# Patient Record
Sex: Female | Born: 1960 | Race: Black or African American | Hispanic: No | Marital: Married | Smoking: Never smoker
Health system: Southern US, Community
[De-identification: ages and names within clinical notes are randomized; demographics above are authoritative.]

## PROBLEM LIST (undated history)

## (undated) DIAGNOSIS — Z86010 Personal history of colonic polyps: Secondary | ICD-10-CM

## (undated) DIAGNOSIS — I10 Essential (primary) hypertension: Secondary | ICD-10-CM

## (undated) DIAGNOSIS — IMO0001 Reserved for inherently not codable concepts without codable children: Secondary | ICD-10-CM

## (undated) DIAGNOSIS — D649 Anemia, unspecified: Secondary | ICD-10-CM

## (undated) DIAGNOSIS — J189 Pneumonia, unspecified organism: Secondary | ICD-10-CM

## (undated) DIAGNOSIS — I5022 Chronic systolic (congestive) heart failure: Secondary | ICD-10-CM

## (undated) DIAGNOSIS — D120 Benign neoplasm of cecum: Secondary | ICD-10-CM

## (undated) DIAGNOSIS — I509 Heart failure, unspecified: Secondary | ICD-10-CM

## (undated) HISTORY — DX: Anemia, unspecified: D64.9

## (undated) HISTORY — PX: OTHER SURGICAL HISTORY: SHX169

## (undated) HISTORY — DX: Pneumonia, unspecified organism: J18.9

---

## 1999-06-09 ENCOUNTER — Other Ambulatory Visit: Admission: RE | Admit: 1999-06-09 | Discharge: 1999-06-09 | Payer: Self-pay | Admitting: *Deleted

## 1999-07-14 ENCOUNTER — Other Ambulatory Visit: Admission: RE | Admit: 1999-07-14 | Discharge: 1999-07-14 | Payer: Self-pay | Admitting: *Deleted

## 2000-07-19 ENCOUNTER — Encounter: Admission: RE | Admit: 2000-07-19 | Discharge: 2000-08-10 | Payer: Self-pay | Admitting: Orthopedic Surgery

## 2003-02-01 ENCOUNTER — Emergency Department (HOSPITAL_COMMUNITY): Admission: EM | Admit: 2003-02-01 | Discharge: 2003-02-01 | Payer: Self-pay | Admitting: Emergency Medicine

## 2004-03-22 ENCOUNTER — Emergency Department (HOSPITAL_COMMUNITY): Admission: EM | Admit: 2004-03-22 | Discharge: 2004-03-22 | Payer: Self-pay | Admitting: Internal Medicine

## 2005-03-08 ENCOUNTER — Other Ambulatory Visit: Admission: RE | Admit: 2005-03-08 | Discharge: 2005-03-08 | Payer: Self-pay | Admitting: Obstetrics and Gynecology

## 2008-04-27 ENCOUNTER — Ambulatory Visit: Payer: Self-pay | Admitting: Internal Medicine

## 2008-04-27 ENCOUNTER — Encounter (INDEPENDENT_AMBULATORY_CARE_PROVIDER_SITE_OTHER): Payer: Self-pay | Admitting: Internal Medicine

## 2008-04-27 ENCOUNTER — Inpatient Hospital Stay (HOSPITAL_COMMUNITY): Admission: EM | Admit: 2008-04-27 | Discharge: 2008-05-04 | Payer: Self-pay | Admitting: Emergency Medicine

## 2008-04-29 ENCOUNTER — Encounter (INDEPENDENT_AMBULATORY_CARE_PROVIDER_SITE_OTHER): Payer: Self-pay | Admitting: Internal Medicine

## 2008-05-01 ENCOUNTER — Encounter: Payer: Self-pay | Admitting: Internal Medicine

## 2008-05-17 ENCOUNTER — Telehealth: Payer: Self-pay | Admitting: Internal Medicine

## 2008-06-04 ENCOUNTER — Encounter (INDEPENDENT_AMBULATORY_CARE_PROVIDER_SITE_OTHER): Payer: Self-pay | Admitting: *Deleted

## 2008-06-04 ENCOUNTER — Ambulatory Visit: Payer: Self-pay | Admitting: Internal Medicine

## 2008-06-04 DIAGNOSIS — I5022 Chronic systolic (congestive) heart failure: Secondary | ICD-10-CM

## 2008-06-17 ENCOUNTER — Ambulatory Visit (HOSPITAL_COMMUNITY): Admission: RE | Admit: 2008-06-17 | Discharge: 2008-06-17 | Payer: Self-pay | Admitting: Internal Medicine

## 2008-06-17 ENCOUNTER — Ambulatory Visit: Payer: Self-pay | Admitting: Internal Medicine

## 2008-06-18 ENCOUNTER — Telehealth (INDEPENDENT_AMBULATORY_CARE_PROVIDER_SITE_OTHER): Payer: Self-pay | Admitting: *Deleted

## 2009-02-19 ENCOUNTER — Inpatient Hospital Stay (HOSPITAL_COMMUNITY): Admission: EM | Admit: 2009-02-19 | Discharge: 2009-02-20 | Payer: Self-pay | Admitting: Emergency Medicine

## 2009-02-19 ENCOUNTER — Ambulatory Visit: Payer: Self-pay | Admitting: Cardiovascular Disease

## 2009-02-20 ENCOUNTER — Encounter (INDEPENDENT_AMBULATORY_CARE_PROVIDER_SITE_OTHER): Payer: Self-pay | Admitting: Internal Medicine

## 2010-03-01 LAB — CONVERTED CEMR LAB
ALT: 40 units/L — ABNORMAL HIGH (ref 0–35)
AST: 35 units/L (ref 0–37)
Albumin: 3.7 g/dL (ref 3.5–5.2)
Alkaline Phosphatase: 56 units/L (ref 39–117)
BUN: 8 mg/dL (ref 6–23)
Bilirubin, Direct: 0.1 mg/dL (ref 0.0–0.3)
CO2: 32 meq/L (ref 19–32)
Calcium: 9.5 mg/dL (ref 8.4–10.5)
Chloride: 104 meq/L (ref 96–112)
Creatinine, Ser: 0.9 mg/dL (ref 0.4–1.2)
GFR calc non Af Amer: 85.39 mL/min (ref >60)
Glucose, Bld: 107 mg/dL — ABNORMAL HIGH (ref 70–99)
Potassium: 4 meq/L (ref 3.5–5.1)
Pro B Natriuretic peptide (BNP): 78 pg/mL (ref 0.0–100.0)
Sodium: 141 meq/L (ref 135–145)
Total Bilirubin: 0.5 mg/dL (ref 0.3–1.2)
Total Protein: 8.2 g/dL (ref 6.0–8.3)

## 2010-04-19 LAB — CBC
HCT: 33.4 % — ABNORMAL LOW (ref 36.0–46.0)
HCT: 35.5 % — ABNORMAL LOW (ref 36.0–46.0)
Hemoglobin: 10.8 g/dL — ABNORMAL LOW (ref 12.0–15.0)
Hemoglobin: 11.6 g/dL — ABNORMAL LOW (ref 12.0–15.0)
MCHC: 32.3 g/dL (ref 30.0–36.0)
MCHC: 32.5 g/dL (ref 30.0–36.0)
MCV: 86 fL (ref 78.0–100.0)
MCV: 87.2 fL (ref 78.0–100.0)
Platelets: 224 10*3/uL (ref 150–400)
Platelets: 248 10*3/uL (ref 150–400)
RBC: 3.83 MIL/uL — ABNORMAL LOW (ref 3.87–5.11)
RBC: 4.13 MIL/uL (ref 3.87–5.11)
RDW: 17.3 % — ABNORMAL HIGH (ref 11.5–15.5)
RDW: 17.5 % — ABNORMAL HIGH (ref 11.5–15.5)
WBC: 3.8 10*3/uL — ABNORMAL LOW (ref 4.0–10.5)
WBC: 4.9 10*3/uL (ref 4.0–10.5)

## 2010-04-19 LAB — BASIC METABOLIC PANEL
BUN: 11 mg/dL (ref 6–23)
BUN: 9 mg/dL (ref 6–23)
CO2: 25 mEq/L (ref 19–32)
CO2: 28 mEq/L (ref 19–32)
CO2: 29 mEq/L (ref 19–32)
Calcium: 8.6 mg/dL (ref 8.4–10.5)
Calcium: 9 mg/dL (ref 8.4–10.5)
Chloride: 103 mEq/L (ref 96–112)
Chloride: 104 mEq/L (ref 96–112)
Chloride: 107 mEq/L (ref 96–112)
Creatinine, Ser: 0.99 mg/dL (ref 0.4–1.2)
Creatinine, Ser: 1.01 mg/dL (ref 0.4–1.2)
GFR calc Af Amer: >60 mL/min (ref >60)
GFR calc Af Amer: >60 mL/min (ref >60)
GFR calc Af Amer: >60 mL/min (ref >60)
GFR calc non Af Amer: 58 mL/min — ABNORMAL LOW (ref >60)
GFR calc non Af Amer: 59 mL/min — ABNORMAL LOW (ref >60)
Glucose, Bld: 107 mg/dL — ABNORMAL HIGH (ref 70–99)
Glucose, Bld: 114 mg/dL — ABNORMAL HIGH (ref 70–99)
Potassium: 3.5 mEq/L (ref 3.5–5.1)
Potassium: 3.9 mEq/L (ref 3.5–5.1)
Potassium: 4.2 mEq/L (ref 3.5–5.1)
Sodium: 137 mEq/L (ref 135–145)
Sodium: 137 mEq/L (ref 135–145)
Sodium: 138 mEq/L (ref 135–145)

## 2010-04-19 LAB — DIFFERENTIAL
Basophils Absolute: 0 10*3/uL (ref 0.0–0.1)
Basophils Relative: 1 % (ref 0–1)
Eosinophils Absolute: 0 10*3/uL (ref 0.0–0.7)
Eosinophils Relative: 0 % (ref 0–5)
Lymphocytes Relative: 38 % (ref 12–46)
Lymphs Abs: 1.5 10*3/uL (ref 0.7–4.0)
Monocytes Absolute: 0.5 10*3/uL (ref 0.1–1.0)
Monocytes Relative: 12 % (ref 3–12)
Neutro Abs: 1.9 10*3/uL (ref 1.7–7.7)
Neutrophils Relative %: 49 % (ref 43–77)

## 2010-04-19 LAB — URINE MICROSCOPIC-ADD ON

## 2010-04-19 LAB — BRAIN NATRIURETIC PEPTIDE
Pro B Natriuretic peptide (BNP): 558 pg/mL — ABNORMAL HIGH (ref 0.0–100.0)
Pro B Natriuretic peptide (BNP): 985 pg/mL — ABNORMAL HIGH (ref 0.0–100.0)

## 2010-04-19 LAB — URINALYSIS, ROUTINE W REFLEX MICROSCOPIC
Bilirubin Urine: NEGATIVE
Glucose, UA: NEGATIVE mg/dL
Ketones, ur: NEGATIVE mg/dL
Leukocytes, UA: NEGATIVE
Nitrite: NEGATIVE
Protein, ur: NEGATIVE mg/dL
Specific Gravity, Urine: 1.015 (ref 1.005–1.030)
Urobilinogen, UA: 1 mg/dL (ref 0.0–1.0)
pH: 6.5 (ref 5.0–8.0)

## 2010-04-19 LAB — CARDIAC PANEL(CRET KIN+CKTOT+MB+TROPI)
CK, MB: 0.9 ng/mL (ref 0.3–4.0)
CK, MB: 1 ng/mL (ref 0.3–4.0)
Relative Index: INVALID (ref 0.0–2.5)
Relative Index: INVALID (ref 0.0–2.5)
Total CK: 70 U/L (ref 7–177)
Total CK: 94 U/L (ref 7–177)
Troponin I: 0.02 ng/mL (ref 0.00–0.06)

## 2010-04-19 LAB — PHOSPHORUS: Phosphorus: 3.7 mg/dL (ref 2.3–4.6)

## 2010-04-19 LAB — LIPID PANEL
LDL Cholesterol: 74 mg/dL (ref 0–99)
Total CHOL/HDL Ratio: 2.7 RATIO
VLDL: 21 mg/dL (ref 0–40)

## 2010-04-19 LAB — MAGNESIUM: Magnesium: 2.2 mg/dL (ref 1.5–2.5)

## 2010-04-19 LAB — TSH: TSH: 2.264 u[IU]/mL (ref 0.350–4.500)

## 2010-04-19 LAB — D-DIMER, QUANTITATIVE: D-Dimer, Quant: 0.81 ug/mL-FEU — ABNORMAL HIGH (ref 0.00–0.48)

## 2010-04-19 LAB — APTT: aPTT: 23 seconds — ABNORMAL LOW (ref 24–37)

## 2010-04-19 LAB — PROTIME-INR
INR: 1.02 (ref 0.00–1.49)
Prothrombin Time: 13.3 seconds (ref 11.6–15.2)

## 2010-05-13 LAB — BASIC METABOLIC PANEL
CO2: 26 mEq/L (ref 19–32)
Calcium: 9 mg/dL (ref 8.4–10.5)
Calcium: 9.2 mg/dL (ref 8.4–10.5)
Chloride: 102 mEq/L (ref 96–112)
Chloride: 106 mEq/L (ref 96–112)
Creatinine, Ser: 0.85 mg/dL (ref 0.4–1.2)
GFR calc Af Amer: >60 mL/min (ref >60)
GFR calc Af Amer: >60 mL/min (ref >60)
Glucose, Bld: 128 mg/dL — ABNORMAL HIGH (ref 70–99)
Potassium: 4.2 mEq/L (ref 3.5–5.1)
Sodium: 139 mEq/L (ref 135–145)

## 2010-05-13 LAB — CBC
HCT: 33.6 % — ABNORMAL LOW (ref 36.0–46.0)
HCT: 33.9 % — ABNORMAL LOW (ref 36.0–46.0)
Hemoglobin: 10.6 g/dL — ABNORMAL LOW (ref 12.0–15.0)
MCHC: 31.2 g/dL (ref 30.0–36.0)
MCHC: 31.7 g/dL (ref 30.0–36.0)
MCV: 76.2 fL — ABNORMAL LOW (ref 78.0–100.0)
MCV: 76.9 fL — ABNORMAL LOW (ref 78.0–100.0)
Platelets: 321 10*3/uL (ref 150–400)
RBC: 4.46 MIL/uL (ref 3.87–5.11)
WBC: 4.7 10*3/uL (ref 4.0–10.5)

## 2010-05-13 LAB — BRAIN NATRIURETIC PEPTIDE: Pro B Natriuretic peptide (BNP): 280 pg/mL — ABNORMAL HIGH (ref 0.0–100.0)

## 2010-05-13 LAB — COMPREHENSIVE METABOLIC PANEL
Albumin: 3.6 g/dL (ref 3.5–5.2)
BUN: 10 mg/dL (ref 6–23)
Calcium: 9.5 mg/dL (ref 8.4–10.5)
Chloride: 106 mEq/L (ref 96–112)
Creatinine, Ser: 0.99 mg/dL (ref 0.4–1.2)
GFR calc non Af Amer: 60 mL/min — ABNORMAL LOW (ref >60)
Total Bilirubin: 0.8 mg/dL (ref 0.3–1.2)

## 2010-05-13 LAB — PROTEIN ELECTROPHORESIS, SERUM
Gamma Globulin: 20.8 % — ABNORMAL HIGH (ref 11.1–18.8)
M-Spike, %: NOT DETECTED g/dL

## 2010-05-13 LAB — HEPATITIS B CORE ANTIBODY, IGM

## 2010-05-14 LAB — HEPATIC FUNCTION PANEL
ALT: 93 U/L — ABNORMAL HIGH (ref 0–35)
AST: 40 U/L — ABNORMAL HIGH (ref 0–37)
Albumin: 2.9 g/dL — ABNORMAL LOW (ref 3.5–5.2)
Bilirubin, Direct: 0.1 mg/dL (ref 0.0–0.3)
Total Bilirubin: 0.7 mg/dL (ref 0.3–1.2)

## 2010-05-14 LAB — BASIC METABOLIC PANEL
BUN: 6 mg/dL (ref 6–23)
CO2: 24 mEq/L (ref 19–32)
CO2: 27 mEq/L (ref 19–32)
Calcium: 8.6 mg/dL (ref 8.4–10.5)
Chloride: 102 mEq/L (ref 96–112)
Chloride: 110 mEq/L (ref 96–112)
Creatinine, Ser: 1 mg/dL (ref 0.4–1.2)
GFR calc Af Amer: >60 mL/min (ref >60)
GFR calc Af Amer: >60 mL/min (ref >60)
GFR calc Af Amer: >60 mL/min (ref >60)
GFR calc non Af Amer: 55 mL/min — ABNORMAL LOW (ref >60)
Glucose, Bld: 99 mg/dL (ref 70–99)
Potassium: 4.2 mEq/L (ref 3.5–5.1)
Sodium: 133 mEq/L — ABNORMAL LOW (ref 135–145)
Sodium: 138 mEq/L (ref 135–145)
Sodium: 139 mEq/L (ref 135–145)

## 2010-05-14 LAB — LIPID PANEL
Cholesterol: 78 mg/dL (ref 0–200)
HDL: 21 mg/dL — ABNORMAL LOW (ref >39)

## 2010-05-14 LAB — CROSSMATCH

## 2010-05-14 LAB — RAPID URINE DRUG SCREEN, HOSP PERFORMED
Amphetamines: NOT DETECTED
Barbiturates: NOT DETECTED
Benzodiazepines: NOT DETECTED
Opiates: NOT DETECTED
Tetrahydrocannabinol: NOT DETECTED

## 2010-05-14 LAB — DIFFERENTIAL
Basophils Absolute: 0 K/uL (ref 0.0–0.1)
Basophils Relative: 1 % (ref 0–1)
Eosinophils Absolute: 0 10*3/uL (ref 0.0–0.7)
Eosinophils Relative: 0 % (ref 0–5)
Lymphocytes Relative: 22 % (ref 12–46)
Lymphs Abs: 1.2 10*3/uL (ref 0.7–4.0)
Monocytes Absolute: 0.6 K/uL (ref 0.1–1.0)
Monocytes Relative: 12 % (ref 3–12)
Neutro Abs: 3.4 K/uL (ref 1.7–7.7)
Neutrophils Relative %: 65 % (ref 43–77)

## 2010-05-14 LAB — PROTIME-INR: Prothrombin Time: 14.8 seconds (ref 11.6–15.2)

## 2010-05-14 LAB — FERRITIN: Ferritin: 7 ng/mL — ABNORMAL LOW (ref 10–291)

## 2010-05-14 LAB — PREGNANCY, URINE: Preg Test, Ur: NEGATIVE

## 2010-05-14 LAB — BRAIN NATRIURETIC PEPTIDE
Pro B Natriuretic peptide (BNP): 1090 pg/mL — ABNORMAL HIGH (ref 0.0–100.0)
Pro B Natriuretic peptide (BNP): 720 pg/mL — ABNORMAL HIGH (ref 0.0–100.0)
Pro B Natriuretic peptide (BNP): 990 pg/mL — ABNORMAL HIGH (ref 0.0–100.0)

## 2010-05-14 LAB — RETICULOCYTES
RBC.: 4.02 MIL/uL (ref 3.87–5.11)
Retic Count, Absolute: 60.3 K/uL (ref 19.0–186.0)
Retic Ct Pct: 1.5 % (ref 0.4–3.1)

## 2010-05-14 LAB — BLOOD GAS, VENOUS
Acid-base deficit: 1.4 mmol/L (ref 0.0–2.0)
Bicarbonate: 22.8 mEq/L (ref 20.0–24.0)
FIO2: 0.21 %
O2 Saturation: 9.4 %
Patient temperature: 98.6
TCO2: 22 mmol/L (ref 0–100)
pCO2, Ven: 38.4 mmHg — ABNORMAL LOW (ref 45.0–50.0)
pH, Ven: 7.391 — ABNORMAL HIGH (ref 7.250–7.300)
pO2, Ven: 13.1 mmHg — CL (ref 30.0–45.0)

## 2010-05-14 LAB — HEPATITIS PANEL, ACUTE
HCV Ab: NEGATIVE
Hep A IgM: NEGATIVE
Hepatitis B Surface Ag: NEGATIVE

## 2010-05-14 LAB — URINALYSIS, ROUTINE W REFLEX MICROSCOPIC
Bilirubin Urine: NEGATIVE
Bilirubin Urine: NEGATIVE
Glucose, UA: NEGATIVE mg/dL
Ketones, ur: NEGATIVE mg/dL
Ketones, ur: NEGATIVE mg/dL
Leukocytes, UA: NEGATIVE
Nitrite: NEGATIVE
Nitrite: NEGATIVE
Protein, ur: 30 mg/dL — AB
Specific Gravity, Urine: 1.022 (ref 1.005–1.030)
Urobilinogen, UA: 0.2 mg/dL (ref 0.0–1.0)
Urobilinogen, UA: 1 mg/dL (ref 0.0–1.0)
pH: 5.5 (ref 5.0–8.0)
pH: 6 (ref 5.0–8.0)

## 2010-05-14 LAB — GLUCOSE, CAPILLARY: Glucose-Capillary: 109 mg/dL — ABNORMAL HIGH (ref 70–99)

## 2010-05-14 LAB — CBC
HCT: 24.8 % — ABNORMAL LOW (ref 36.0–46.0)
HCT: 25.1 % — ABNORMAL LOW (ref 36.0–46.0)
HCT: 25.9 % — ABNORMAL LOW (ref 36.0–46.0)
HCT: 29.4 % — ABNORMAL LOW (ref 36.0–46.0)
Hemoglobin: 7.8 g/dL — CL (ref 12.0–15.0)
Hemoglobin: 7.9 g/dL — CL (ref 12.0–15.0)
Hemoglobin: 8.1 g/dL — ABNORMAL LOW (ref 12.0–15.0)
Hemoglobin: 8.1 g/dL — ABNORMAL LOW (ref 12.0–15.0)
Hemoglobin: 9.1 g/dL — ABNORMAL LOW (ref 12.0–15.0)
MCHC: 31.1 g/dL (ref 30.0–36.0)
MCHC: 31.3 g/dL (ref 30.0–36.0)
MCHC: 31.4 g/dL (ref 30.0–36.0)
MCHC: 31.5 g/dL (ref 30.0–36.0)
MCV: 75 fL — ABNORMAL LOW (ref 78.0–100.0)
MCV: 75.1 fL — ABNORMAL LOW (ref 78.0–100.0)
MCV: 75.3 fL — ABNORMAL LOW (ref 78.0–100.0)
MCV: 75.4 fL — ABNORMAL LOW (ref 78.0–100.0)
Platelets: 252 10*3/uL (ref 150–400)
Platelets: 260 K/uL (ref 150–400)
RBC: 3.3 MIL/uL — ABNORMAL LOW (ref 3.87–5.11)
RBC: 3.34 MIL/uL — ABNORMAL LOW (ref 3.87–5.11)
RBC: 3.4 MIL/uL — ABNORMAL LOW (ref 3.87–5.11)
RBC: 3.43 MIL/uL — ABNORMAL LOW (ref 3.87–5.11)
RBC: 3.9 MIL/uL (ref 3.87–5.11)
RDW: 17.6 % — ABNORMAL HIGH (ref 11.5–15.5)
RDW: 18 % — ABNORMAL HIGH (ref 11.5–15.5)
WBC: 5.3 10*3/uL (ref 4.0–10.5)
WBC: 5.4 10*3/uL (ref 4.0–10.5)
WBC: 5.7 10*3/uL (ref 4.0–10.5)

## 2010-05-14 LAB — URINE MICROSCOPIC-ADD ON

## 2010-05-14 LAB — COMPREHENSIVE METABOLIC PANEL
ALT: 75 U/L — ABNORMAL HIGH (ref 0–35)
AST: 53 U/L — ABNORMAL HIGH (ref 0–37)
BUN: 7 mg/dL (ref 6–23)
CO2: 23 mEq/L (ref 19–32)
Calcium: 8.2 mg/dL — ABNORMAL LOW (ref 8.4–10.5)
Calcium: 8.4 mg/dL (ref 8.4–10.5)
Chloride: 102 mEq/L (ref 96–112)
GFR calc Af Amer: >60 mL/min (ref >60)
GFR calc non Af Amer: >60 mL/min (ref >60)
GFR calc non Af Amer: >60 mL/min (ref >60)
Glucose, Bld: 96 mg/dL (ref 70–99)
Potassium: 3.4 mEq/L — ABNORMAL LOW (ref 3.5–5.1)
Sodium: 132 mEq/L — ABNORMAL LOW (ref 135–145)
Total Bilirubin: 0.8 mg/dL (ref 0.3–1.2)
Total Protein: 5.5 g/dL — ABNORMAL LOW (ref 6.0–8.3)

## 2010-05-14 LAB — CK TOTAL AND CKMB (NOT AT ARMC)
CK, MB: 0.9 ng/mL (ref 0.3–4.0)
CK, MB: 1 ng/mL (ref 0.3–4.0)
CK, MB: 1.1 ng/mL (ref 0.3–4.0)
Relative Index: INVALID (ref 0.0–2.5)
Total CK: 58 U/L (ref 7–177)
Total CK: 59 U/L (ref 7–177)
Total CK: 68 U/L (ref 7–177)

## 2010-05-14 LAB — HEMOCCULT GUIAC POC 1CARD (OFFICE): Fecal Occult Bld: POSITIVE

## 2010-05-14 LAB — COMPREHENSIVE METABOLIC PANEL WITH GFR
Albumin: 3.5 g/dL (ref 3.5–5.2)
Alkaline Phosphatase: 55 U/L (ref 39–117)
BUN: 11 mg/dL (ref 6–23)
Creatinine, Ser: 0.96 mg/dL (ref 0.4–1.2)
Glucose, Bld: 123 mg/dL — ABNORMAL HIGH (ref 70–99)
Total Protein: 6.3 g/dL (ref 6.0–8.3)

## 2010-05-14 LAB — TROPONIN I
Troponin I: 0.02 ng/mL (ref 0.00–0.06)
Troponin I: 0.02 ng/mL (ref 0.00–0.06)

## 2010-05-14 LAB — D-DIMER, QUANTITATIVE: D-Dimer, Quant: 5.24 ug/mL-FEU — ABNORMAL HIGH (ref 0.00–0.48)

## 2010-05-14 LAB — FOLATE: Folate: 10.8 ng/mL

## 2010-05-14 LAB — IRON AND TIBC
Iron: 14 ug/dL — ABNORMAL LOW (ref 42–135)
Saturation Ratios: 4 % — ABNORMAL LOW (ref 20–55)
TIBC: 380 ug/dL (ref 250–470)
UIBC: 366 ug/dL

## 2010-05-14 LAB — TSH: TSH: 1.632 u[IU]/mL (ref 0.350–4.500)

## 2010-05-14 LAB — ANGIOTENSIN CONVERTING ENZYME: Angiotensin-Converting Enzyme: 32 U/L (ref 9–67)

## 2010-05-14 LAB — LACTATE DEHYDROGENASE: LDH: 164 U/L (ref 94–250)

## 2010-05-14 LAB — ABO/RH: ABO/RH(D): O POS

## 2010-05-14 LAB — LIPASE, BLOOD: Lipase: 12 U/L (ref 11–59)

## 2010-05-14 LAB — APTT: aPTT: 30 seconds (ref 24–37)

## 2010-05-14 LAB — VITAMIN B12: Vitamin B-12: 641 pg/mL (ref 211–911)

## 2010-05-25 IMAGING — CR DG CHEST 1V PORT
1 series · 1 of 1 positions shown · non-contrast
Comparison: 03/22/2004

CLINICAL DATA: Weakness.  Vomiting.  Chest discomfort.

PORTABLE CHEST - 1 VIEW

[view not recorded]
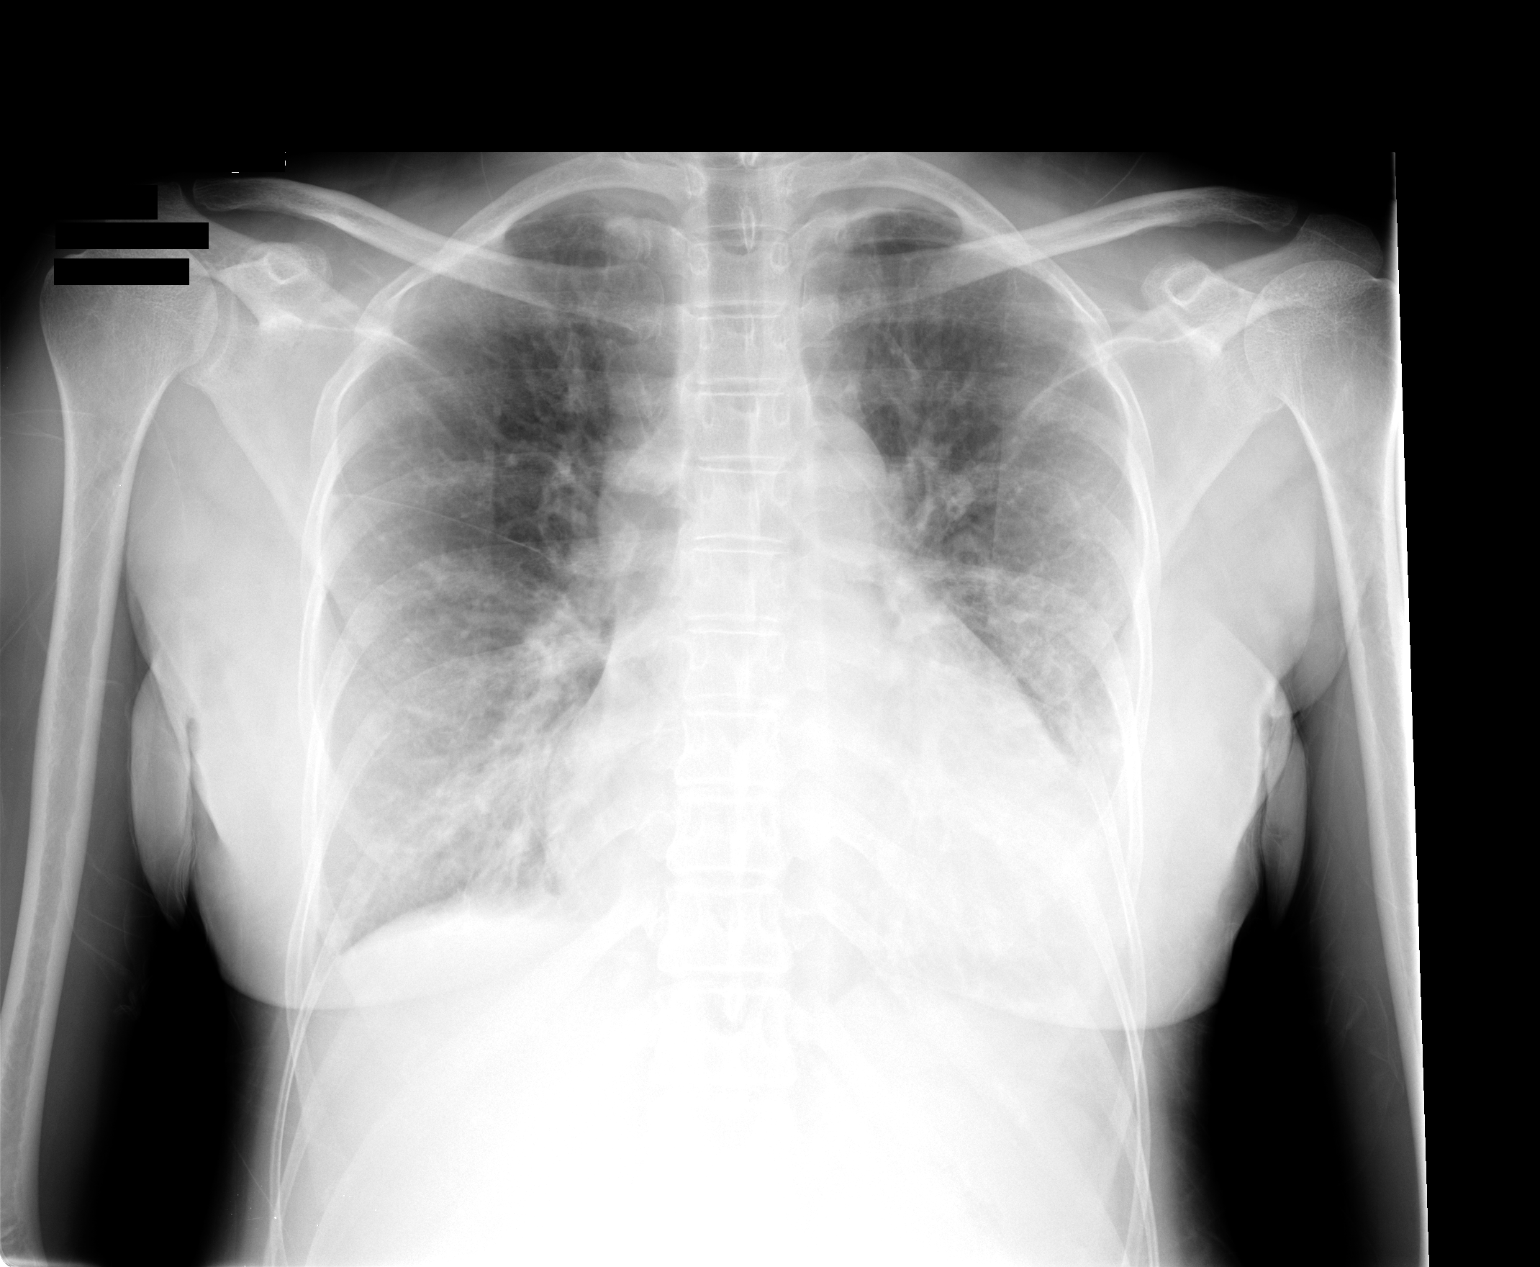

[1 of 1 positions shown; findings below may reference images not displayed]

FINDINGS: Mild to moderate osteopenia. Midline trachea. Mild to
moderate enlargement of the cardiopericardial silhouette.  Small
left pleural effusion. No pneumothorax.  Mild interstitial
prominence and indistinctness.  Patchy bibasilar left greater than
right airspace disease.
IMPRESSION: 1.  Cardiomegaly with mild interstitial edema suspicious for
congestive heart failure.
2.  Small left pleural effusion.
3.  Bibasilar left greater than right airspace disease.  Either
atelectasis or concurrent multifocal infection.

## 2010-06-16 NOTE — Consult Note (Signed)
NAME:  Diana Bird, Diana Bird NO.:  [id number]   MEDICAL RECORD NO.:  [id number]          PATIENT TYPE:  INP   LOCATION:  1433                         FACILITY:  [facility]   PHYSICIAN:  Bevelyn Buckles. Bensimhon, MDDATE OF BIRTH:  1960/08/05   DATE OF CONSULTATION:  04/30/2008  DATE OF DISCHARGE:                                 CONSULTATION   PRIMARY CARE PHYSICIAN:  None currently.  Will be Dr. Trula Slade.   CONSULTING PHYSICIAN:  Dr. Trula Ore Rama of the [facility].   REASON FOR CONSULTATION:  Congestive heart failure.   Diana Bird is a delightful, 50 year old woman with a history of  congenital/chronic anemia.  She has had workup in the past for sickle  cell disease and thalassemia, and this has reportedly been negative.  There has been no clear etiology to her anemia.  She also has a family  history of congestive heart failure with her mother passing at age 45  due to heart failure.  Otherwise, she has no significant past medical  history.  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   About 2 months ago, she had a viral illness which she did get somewhat  better from but sort of felt a little bit draggy.  Over the past week,  she has become increasingly very weak, anorexic and suffering from  nausea, vomiting, and orthopnea.  Her exercise tolerance has been  markedly diminished.  Initially, she denied any chest pain to me but she  did report some chest pain to the hospitalist physician when she came  in.   She was admitted to hospital on April 27, 2008.  BNP was elevated at  1090.  She had CHF on her chest x-ray.  She underwent echocardiogram  which showed severe biventricular dysfunction with a mean left  ventricular ejection fraction of 10% to 15%.  The RV was also  hypokinetic.  She has been treated with low-dose oral Lasix and has had  some symptomatic improvement.   On admission, her D-dimer is markedly  elevated at 5.24.  CT scan of the  chest showed no evidence of PE.  There was some mediastinal  lymphadenopathy which was thought to be reactive although  lymphoproliferative disorders could not be ruled out   The remainder of the review of systems has been negative except for HPI  and problem list.  Specifically, she denies any neurologic symptoms.  She has not had any syncope, palpitations.  No bright red blood per  rectum.  No melena.   PAST MEDICAL HISTORY:  Chronic anemia, unclear cause.   CURRENT MEDICATIONS:  1. Albuterol.  2. Lasix 20 b.i.d.  3. Lisinopril 10 a day.  4. Potassium 20 b.i.d.  5. Flagyl.  6. IV iron.   ALLERGIES:  NO KNOWN DRUG ALLERGIES.  .   SOCIAL HISTORY:  She is married.  She has 1 child.  [place] in  [place]  Employed as a Merchandiser, retail.  Denies  tobacco.  She drinks wine on occasion.  No drug use.   FAMILY HISTORY:  Father, 71 years of age, has hypertension, no heart  disease.  Mother died at age of 64 due to heart failure.   PHYSICAL EXAM:  She is lying flat in bed in no acute distress.  Respirations are unlabored.  She is satting 97% on room air.  Blood  pressure 117/84, heart rate is 97.  HEENT:  Normal.  NECK:  Supple.  JVP is elevated to about 10 cm.  CARDIAC:  PMI is laterally displaced.  She is regular with an S3.  No  obvious murmur.  LUNGS:  Clear.  ABDOMEN:  Distended with her liver edge 3 or 4 fingerbreadths in the  right costal margin.  EXTREMITIES:  Warm with no cyanosis, clubbing, or edema.  Good distal  pulses.  No rash.  NEURO:  Alert and oriented x3.  Cranial nerves II-XII are intact.  Moves  all 4 extremities without difficulty.  Affect is pleasant.   EKG shows sinus rhythm with anteroseptal Q-waves.  Heart rate of 89.  There is T-wave inversion in V6.   Chest x-ray shows CHF.   Sodium 138, potassium 4.3, BUN of 6, creatinine 1.0.  White count of  5.7, hemoglobin 8.1, platelets 239,000.  BNP is  990.  D-dimer is 5.24.  Urine drug screen was negative.  Thyroid panel was negative.  Troponin  was 0.02.   ASSESSMENT:  1. Acute systolic heart failure with severe biventricular dysfunction,      ejection fraction 15% to 20%.  2. CT of the chest negative for PE but notable for mediastinal      lymphadenopathy.  3. Chronic anemia.  4. Family history of congestive heart failure.   PLAN/DISCUSSION:  Despite her EKG, I suspect her cardiomyopathy is  nonischemic, possibly viral in nature.  With her lymphadenopathy on  chest x-ray, this raises a question of sarcoid.  She does not have any  other features of this.  I think the best plan now is to check a cardiac  MRI to look for infiltrative disease and scar as well as reassess her LV  function.  I doubt cardiac catheterization is needed at this point and,  of note, she refuses this at this time anyway.  We can reconsider if her  MRI shows scar.   From a medication standpoint, we will increase her Lasix, add Coreg and  digoxin, and titrate her lisinopril as tolerated.  We will check  serologies including HIV panel, ACE level, and hepatitis panels.  We  will follow with you.  We appreciate the consult.      Bevelyn Buckles. Bensimhon, MD  Electronically Signed     DRB/MEDQ  D:  04/30/2008  T:  04/30/2008  Job:  [id number]

## 2010-06-16 NOTE — H&P (Signed)
NAME:  Diana Bird, Diana Bird NO.:  [id number]   MEDICAL RECORD NO.:  [id number]          PATIENT TYPE:  EMS   LOCATION:  ED                           FACILITY:  [facility]   PHYSICIAN:  Elliot Cousin, M.D.    DATE OF BIRTH:  09-Feb-1960   DATE OF ADMISSION:  04/27/2008  DATE OF DISCHARGE:                              HISTORY & PHYSICAL   PRIMARY CARE PHYSICIAN:  The patient is unassigned.   CHIEF COMPLAINT:  Generalized weakness, nausea and vomiting, and  shortness of breath.   HISTORY OF PRESENT ILLNESS:  The patient is a 50 year old woman with a  past medical history significant for chronic anemia, who presents to the  emergency department with a chief complaint of generalized weakness.  She started becoming weak after having multiple episodes of nausea and  vomiting over the past 3 days.  The nausea and vomiting have been  intermittent and are prompted by her attempts to eat.  She denies  associated coffee ground emesis and bright red blood in her emesis.  She  also denies abdominal pain, constipation, diarrhea, bright red blood per  rectum, black tarry stools and pain with urination.  She has not been  exposed to any known contacts.  She has not started any new medications.  She has had no recent travel.  Her close contacts have not been sick.  She has also had intermittent shortness of breath with activity, with  rest, and when laying flat.  Her shortness of breath started  approximately 3-4 days ago.  Occasionally it is associated with left-  sided chest pain without associated diaphoresis, radiation of the pain,  or lightheadedness.  The chest pain has been mild in intensity on a  scale of mild to severe.  It comes and goes without any association with  activity or rest.  She has had an occasional cough, no worse than usual.  She does not smoke.  She has had no associated fever or chills.   During the evaluation in the emergency department, the patient is noted  to  be afebrile and hemodynamically stable.  Her chest x-ray reveals  cardiomegaly with mild interstitial edema suspicious for congestive  heart failure; small left pleural effusion; bibasilar left greater than  right air space disease; either atelectasis or concurrent infection.  Her BNP is elevated at 1090.  Her serum sodium is slightly low at 132  and her serum potassium is slightly low at 3.4.  Her EKG reveals normal  sinus rhythm with lateral T-wave abnormalities and left axis deviation.  She is being admitted for further evaluation and management.   PAST MEDICAL HISTORY:  1. Chronic anemia.  2. Possibly perimenopausal.   MEDICATIONS:  None.   ALLERGIES:  NO KNOWN DRUG ALLERGIES.   SOCIAL HISTORY:  The patient is married.  [place]  She has one son.  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  She denies tobacco and illicit drug use.  She drinks wine on occasion.   Her father is 52 years of age and has hypertension.  He has no history  of  heart disease.  Her mother died of complications of heart disease,  query congestive heart failure at 52 years of age.   REVIEW OF SYSTEMS:  The patient's review of systems is positive for  irregular menstrual periods with her last menstrual period being in the  first week of February.  Her menstrual flow is usually moderate and  occasionally heavy.  Otherwise, her review of systems is negative.   PHYSICAL EXAMINATION:  VITAL SIGNS:  Temperature 98.2, blood pressure  118/83, pulse 96, respiratory rate 20.  Oxygen saturation 98% on room  air.  GENERAL:  The patient is a pleasant 50 year old African American woman  who is currently lying in bed in no acute distress.  HEENT:  Head is normocephalic, nontraumatic.  Pupils equal, round and  reactive to light.  Extraocular movements are intact.  Conjunctivae are  clear.  Sclerae are white.  Tympanic membranes not examined.  Nasal  mucosa is mildly dry.  No sinus  tenderness.  Oropharynx reveals fair  dentition.  Mucous membranes are moist.  No posterior exudates or  erythema.  NECK:  Supple.  No adenopathy, no thyromegaly, no bruit, no JVD.  LUNGS:  Occasional faint wheezes and bibasilar crackles auscultated.  Breathing is nonlabored.  HEART:  S1-S2 with a soft systolic murmur.  ABDOMEN:  Positive bowel sounds, mildly obese, soft, nontender,  nondistended.  No hepatosplenomegaly, no masses palpated.  RECTAL:  Per the ED physician, the patient's stool was brown and guaiac  positive.  GU:  Deferred.  EXTREMITIES:  Pedal pulses palpable bilaterally.  Trace of pedal edema  bilateral.  No pretibial edema.  NEUROLOGIC:  The patient is alert and  oriented x3.  Cranial nerves II-XII are intact.  Strength is 5/5  throughout.  Sensation is intact.   ADMISSION LABORATORIES:  Chest x-ray findings are above.  In addition,  the patient's chest x-ray did reveal cardiomegaly.  EKG reveals normal  sinus rhythm with a heart rate of 99 beats per minute, left axis  deviation, incomplete right bundle branch block, and lateral T-wave  abnormalities.  Urine pregnancy test is negative.  BNP 1090.  Stool is  heme positive.  Troponin-I 0.02, CK 69, CK-MB 0.9, lipase 12.  Sodium  132, potassium 3.4, chloride 102, CO2 of 23, glucose 123, BUN 11,  creatinine 0.96, total bilirubin 0.9, alkaline phosphatase 55, SGOT 53,  SGPT 75, total protein 6.3, albumin 3.5, calcium 8.4.  WBC 5.3,  hemoglobin 9.1, hematocrit 29.4, MCV 75.3, platelets 260.   ASSESSMENT:  1. Generalized weakness.  The etiology is unclear at this time.  The      patient's generalized weakness may be a manifestation of a      nonspecific gastroenteritis, acute congestive heart failure, and      chronic anemia.  There are no focal neurological findings on exam.  2. Probable acute congestive heart failure.  Given that the patient's      BNP is elevated and her chest x-ray is suggestive of edema, more       than likely, the patient has an element of acute congestive heart      failure.  This would be a new diagnosis for the patient.  She has      no known history of heart disease or congestive heart failure.  Per      history, she has not seen a physician in many years, although she      does see her gynecologist occasionally.  She gives no  history of      hypertension as well.  Of note, her mother apparently died of      complications consistent with congestive heart failure.  3. Abnormal EKG.  The patient's EKG reveals an incomplete right bundle      branch block and lateral T-wave changes.  The patient does have      nonspecific atypical chest pain.  Her initial troponin-I and CK are      within normal limits.  4. Mild hyponatremia and hypokalemia.  The patient's serum sodium is      132 and her serum potassium is 3.4.  Magnesium deficiency will need      to be ruled out.  5. Mild hepatic transaminitis in the setting of nausea and vomiting.      Hepatobiliary disease will need to be evaluated.  6. Microcytic anemia.  The patient gives a history of chronic anemia      since childhood.  Her menstrual periods are somewhat irregular.      Per the ED physician, her stool was heme positive.  Her hemoglobin      is 9.1.   PLAN:  1. The patient was given Rocephin and azithromycin by the emergency      department physician for empiric treatment of pneumonia.  However,      given that the patient's white blood cell count is within normal      limits and she is afebrile, antibiotic therapy will not be      continued.  2. Will start intravenous Lasix for treatment of congestive heart      failure.  3. Will start iron supplementation if the patient can tolerate oral      medications.  4. Will initiate supportive treatment with as-needed Mylanta, as      needed morphine, and as-needed antiemetics.  5. For further evaluation will check an anemia panel which apparently      was ordered by the ED  physician.  We will also assess the patient's      magnesium level and TSH.  6. Will check cardiac enzymes q.8 h., x3.  Will order an      echocardiogram to evaluate the patient's cardiomegaly seen on the      chest x-ray and for assessment of congestive heart failure.  7. Will check an ultrasound of the abdomen to evaluate nausea,      vomiting and elevated liver function tests.  8. The patient may need an outpatient gastroenterology or gynecology      evaluation for work-up of anemia.      Elliot Cousin, M.D.  Electronically Signed     DF/MEDQ  D:  04/27/2008  T:  04/27/2008  Job:  [id number]

## 2010-06-16 NOTE — Discharge Summary (Signed)
NAME:  Diana Bird, Diana Bird NO.:  [id number]   MEDICAL RECORD NO.:  [id number]          PATIENT TYPE:  INP   LOCATION:  1433                         FACILITY:  [facility]   PHYSICIAN:  Hillery Aldo, M.D.   DATE OF BIRTH:  12-Feb-1960   DATE OF ADMISSION:  04/27/2008  DATE OF DISCHARGE:  05/04/2008                               DISCHARGE SUMMARY   PRIMARY CARE PHYSICIAN:  Deirdre Peer. Polite, MD   CARDIOLOGIST:  Bevelyn Buckles. Bensimhon, MD   DISCHARGE DIAGNOSES:  1. Acute systolic congestive heart failure.  2. 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  3. Iron deficiency anemia with fecal occult blood positive stool,      outpatient referral to gastroenterology recommended.  4. Transaminitis secondary to passive congestion of the liver.  5. Trichomoniasis.  6. Mediastinal adenopathy.  7. History of menorrhagia.   DISCHARGE MEDICATIONS:  1. Lasix 40 mg p.o. b.i.d. x3 days, then decrease to 40 mg p.o. daily.      Patient is instructed to take a second dose if she experiences      greater than 2-3 pound weight gain in 24 hours thereafter.  2. Coreg 6.25 mg p.o. b.i.d.  3. Digoxin 0.25 mg p.o. daily.  4. Ferrous sulfate 325 mg p.o. b.i.d.  5. Lisinopril 15 mg p.o. daily.  6. Flagyl 500 mg p.o. b.i.d. x2 more days.  7. Potassium chloride 20 mEq p.o. b.i.d.   CONSULTATIONS:  Dr. Gala Romney of cardiology   BRIEF ADMISSION HISTORY OF PRESENT ILLNESS:  Patient is a 50 year old  female with a recent upper respiratory viral infection who presented to  the hospital with a chief complaint of generalized weakness, nausea,  vomiting and increasing dyspnea.  She also has had some associated left-  sided chest pain.  Upon initial evaluation in the emergency department,  she was found to have significant cardiomegaly and evidence suspicious  for congestive heart failure with an elevated BNP of 1.09.  She,  therefore, was admitted for further evaluation and workup.   For the full  details, please see the dictated report done by Dr. Sherrie Mustache.   PROCEDURES AND DIAGNOSTIC STUDIES:  1. Chest x-ray on April 27, 2008, showed cardiomegaly with mild      interstitial edema suspicious for congestive heart failure.  Small      left pleural effusion.  Bibasilar left greater than right airspace      disease.  Either atelectasis or concurrent multifocal infection.  2. Abdominal ultrasound on April 27, 2008, should markedly May 04, 2008, thickened edematous gallbladder wall which can be seen with      hypoproteinemia.  No evidence of ascites.  Right pleural effusion.      Suspicion that the gallbladder appearance was due to either      elevated right heart pressures or hypoproteinemia.  3. Chest x-ray on April 28, 2008, showed worsening pleural effusions      and bilateral airspace opacities.  4. CT angiogram of the chest done April 29, 2008, showed no pulmonary      embolism.  Probable congestive heart failure.  Foci of pneumonia  could not be excluded.  Mediastinal adenopathy which may be      reactive.  Lymphoproliferative disorder could not excluded.  5. A 2-dimensional echocardiogram on April 29, 2008, showed the left      ventricle being mildly dilated.  Overall left ventricular systolic      function was severely reduced with an ejection fraction estimated      at 15% to 20% with severe diffuse left ventricular hypokinesis.      The aortic valve was mildly calcified.  There was mild aortic      valvular regurgitation.  There was mild mitral valvular      regurgitation.  Right ventricular systolic function was markedly      reduced.  The estimated peak pulmonary artery systolic pressure was      mildly to moderately increased.  Estimated peak pulmonary artery      systolic pressure was in the range of 45% to 55% mmHg.  There was      severe tricuspid valvular regurgitation.  The right atrium was      moderately dilated.  Inferior vena cava was  moderately dilated.      Respirophasic inferior vena cava changes were blunted.  (Less than      50% variation).  There was a small pericardial effusion posterior      to the heart.   DISCHARGE LABORATORY VALUES:  Sodium was 135, potassium 4.3, chloride  102, bicarb 26, BUN 9, creatinine 0.85, glucose 91, calcium 9, BNP 280,  angiotensin-converting enzyme levels were normal at 32.  White blood  cell count was 4.7, hemoglobin 10.6, hematocrit 33.6, platelets 321.  Acute hepatitis serologies were negative with the exception of an  indeterminate hepatitis B core antibody IgM.  This test was repeated,  and the results are pending at the time of this dictation.  HIV antibody  was nonreactive.  LDH was 164.  Quantitative beta HCG level was 2.  Lipid profile showed a cholesterol of 78, triglycerides 58, HDL 21, LDL  45.  TSH was 1.632, free T4 of 1.04.  Anemia panel was consistent with  chronic disease with an iron of 14, total iron binding capacity of 380,  4% saturation.  Vitamin B12 was 641.  Serum folate was 10.8.  Ferritin  was low at 7.  Urine drug screen was negative.   HOSPITAL COURSE BY PROBLEM:  1. Acute systolic congestive heart failure/dilated cardiomyopathy:      The patient was admitted and a diagnostic evaluation undertaken      with the findings as noted above.  The source of her congestive      heart failure and dilated cardiomyopathy is not entirely clear.      She had a recent upper respiratory viral infection.  This may be a      postviral syndrome.  She does not appear to have sarcoid, although      she did have some mediastinal adenopathy.  Her angiotensin-      converting enzyme levels were normal.  Her LDH was normal which      does not fit with a lymphoproliferative disorder.  A serum protein      electrophoresis was performed, and the results are pending at the      time of this dictation.  It is likely this is either idiopathic or      postviral in nature.  She has  been fully diuresed and now is      compensated on  the regimen as outlined above.  She has been      instructed to weigh herself daily and to administer an extra dose      of Lasix for any 2- to 3-pound weight gain in 24-hour period of      time.  She has been instructed by the nutritionist regarding the      importance of maintaining a low-sodium diet.  The patient will      follow up with Dr. Gala Romney in 2-3 weeks as well.  2. Iron deficiency anemia in the setting of fecal occult blood      positive stools:  The patient also has a history of menorrhagia.      It is unclear if her fecal occult positive blood was from possible      menses versus a subacute GI bleed. The patient declined treatment      with blood transfusion.  She denied any history of black of bloody      stools.  She does have a history of fibroids per her self-report.      Because she did not want to be treated with packed red blood cells      and her hemoglobin reached a low of 7.8, she was given a 500 mg      dose of Venofer, put on p.o. iron supplementation and had her      hemoglobin and hematocrit checked serially.  She seems to be      responding to treatment with iron therapy as her hemoglobin has      come up to 10.6.  She will be discharged on iron supplementation      therapy and has been instructed that she will need a GI outpatient      evaluation with consideration for colonoscopy to rule out any      occult blood loss through her GI tract.  3. Transaminitis:  The patient's Transaminitis was felt due to passive      congestion of the liver from congestive heart failure.  Viral      hepatitis studies were negative with the exception of an      indeterminate hepatitis B core antibody level.  This has been      rechecked, and the results are pending at the time of this      dictation.  4. Trichomoniasis:  The patient has completed 6 out of a planned      course of 7 days of therapy with Flagyl.  She has been  fully      instructed that her partner will need to be treated for this prior      to the reinitiation of sexual intercourse.  She also was HIV      tested, and this was nonreactive.  5. Hypoproteinemia:  Patient was mildly hypoproteinemic on initial      presentation.  This resolved with her last total protein level      being 6.9 and her albumin being 3.6.  6. Mediastinal adenopathy:  There has not been any evidence of      sarcoid.  Her angiotensin-converting enzyme levels were normal.      This may be a postviral lymphadenopathy.  She should follow up with      Dr. Nehemiah Settle for further evaluation and a followup radiograph to      ensure resolution.   DISPOSITION:  The patient is medically stable and will be discharged  home.  She indicates that she  will follow up with Dr. Nehemiah Settle as her  primary care physician, though she has not yet seen him.  Dr. Nehemiah Settle  will need to follow up with an outpatient GI referral and to follow up  on her serum protein electrophoresis and repeat test of the hepatitis B  core antibody studies that are pending at this time.   Time spent coordinating care for discharge and discharge interstitial  equals 40 minutes.      Hillery Aldo, M.D.  Electronically Signed     CR/MEDQ  D:  05/04/2008  T:  05/04/2008  Job:  [id number]   cc:   Bevelyn Buckles. Bensimhon, MD  [address], [place] [address]   Deirdre Peer. Polite, M.D.

## 2012-11-14 ENCOUNTER — Telehealth (HOSPITAL_COMMUNITY): Payer: Self-pay | Admitting: Cardiology

## 2012-11-14 NOTE — Telephone Encounter (Signed)
Attempting to contact pt to schedule for a NEW pt appt per [facility] Dr.Polite

## 2012-11-16 NOTE — Telephone Encounter (Signed)
Done appt scheduled with pt 12/04/12 @1 :40p New pt packet mailed

## 2012-12-04 ENCOUNTER — Encounter (HOSPITAL_COMMUNITY): Payer: Self-pay

## 2012-12-04 ENCOUNTER — Ambulatory Visit (HOSPITAL_COMMUNITY)
Admission: RE | Admit: 2012-12-04 | Discharge: 2012-12-04 | Disposition: A | Discharge status: Home / Self Care | Payer: BC Managed Care – PPO | Source: Ambulatory Visit | Attending: Internal Medicine | Admitting: Internal Medicine

## 2012-12-04 VITALS — BP 124/72 | HR 71 | Wt 126.0 lb

## 2012-12-04 DIAGNOSIS — I5022 Chronic systolic (congestive) heart failure: Secondary | ICD-10-CM | POA: Insufficient documentation

## 2012-12-04 HISTORY — DX: Essential (primary) hypertension: I10

## 2012-12-04 HISTORY — DX: Chronic systolic (congestive) heart failure: I50.22

## 2012-12-04 MED ORDER — CARVEDILOL 6.25 MG PO TABS: 9.3750 mg | ORAL_TABLET | Freq: Two times a day (BID) | ORAL | Status: DC

## 2012-12-04 NOTE — Patient Instructions (Signed)
Increase coreg to 9.375 mg (1 1/2 tablets) twice a day.  Will call with lab results.  Follow up in 1 month with ECHO.  Do the following things EVERYDAY: 1) Weigh yourself in the morning before breakfast. Write it down and keep it in a log. 2) Take your medicines as prescribed 3) Eat low salt foods-Limit salt (sodium) to 2000 mg per day.  4) Stay as active as you can everyday 5) Limit all fluids for the day to less than 2 liters 6)

## 2013-01-03 ENCOUNTER — Ambulatory Visit (HOSPITAL_COMMUNITY)
Admission: RE | Admit: 2013-01-03 | Discharge: 2013-01-03 | Disposition: A | Discharge status: Home / Self Care | Payer: BC Managed Care – PPO | Source: Ambulatory Visit | Attending: Internal Medicine | Admitting: Internal Medicine

## 2013-01-03 ENCOUNTER — Encounter (HOSPITAL_COMMUNITY): Payer: Self-pay

## 2013-01-03 ENCOUNTER — Ambulatory Visit (HOSPITAL_BASED_OUTPATIENT_CLINIC_OR_DEPARTMENT_OTHER)
Admission: RE | Admit: 2013-01-03 | Discharge: 2013-01-03 | Disposition: A | Discharge status: Home / Self Care | Payer: BC Managed Care – PPO | Source: Ambulatory Visit | Attending: Internal Medicine | Admitting: Internal Medicine

## 2013-01-03 VITALS — BP 110/80 | HR 81 | Ht 62.0 in | Wt 128.0 lb

## 2013-01-03 DIAGNOSIS — I5022 Chronic systolic (congestive) heart failure: Secondary | ICD-10-CM

## 2013-01-03 DIAGNOSIS — I10 Essential (primary) hypertension: Secondary | ICD-10-CM | POA: Insufficient documentation

## 2013-01-03 DIAGNOSIS — Z79899 Other long term (current) drug therapy: Secondary | ICD-10-CM | POA: Insufficient documentation

## 2013-01-03 DIAGNOSIS — I517 Cardiomegaly: Secondary | ICD-10-CM

## 2013-01-03 MED ORDER — LISINOPRIL 10 MG PO TABS: 10.0000 mg | ORAL_TABLET | Freq: Two times a day (BID) | ORAL | Status: DC

## 2013-01-03 NOTE — Progress Notes (Signed)
Patient ID: Diana Bird, female   DOB: Sep 15, 1960, 52 y.o.   MRN: [id number]  Referring Physician: Dr. Nehemiah Settle  Primary Care: Dr. Nehemiah Settle  Primary Cardiologist: N/A  Weight Range: Stable 125-129 lbs   HPI:  Diana Bird is a 52 yo female who was referred to [place] by Dr. Nehemiah Settle for systolic HF. Was admitted to the hospital in 2011 for pneumonia and was told afterwards that her heart was weak. No history of CAD. She does have a history of HTN. Denies every having a heart catheterization.   Follow up: Last visit increased coreg to 9.375 mg BID, tolerated well. Doing great, still dancing and jogging regularly. Denies any palpitations, CP, SOB, orthopnea or edema. Denies nicotine use. Drinks 1-2 glasses of wine every other night. Taking medications as prescribed.   FH: Mother deceased 49 HF; Father living 23 yo and has HTN  2 brothers- 1 deceased; 1 living (HTN)  2 sisters - all living and both have HTN   Labs:  11/19/12: TSH 1.57, cholesterol 187, HDL 55, LDL 98, Creatinine 0.86, k+ 3.9   ROS: All systems negative except as listed in HPI, PMH and Problem List.  Past Medical History  Diagnosis Date  . Chronic systolic heart failure   . Hypertension     Current Outpatient Prescriptions  Medication Sig Dispense Refill  . Calcium Carb-Cholecalciferol (CALCIUM-VITAMIN D) 600-400 MG-UNIT TABS Take 1 tablet by mouth daily.      . carvedilol (COREG) 6.25 MG tablet Take 1.5 tablets (9.375 mg total) by mouth 2 (two) times daily with a meal.  90 tablet  6  . digoxin (LANOXIN) 0.125 MG tablet Take 0.125 mg by mouth daily.      . ferrous sulfate 325 (65 FE) MG tablet Take 325 mg by mouth 2 (two) times daily before a meal.      . furosemide (LASIX) 40 MG tablet Take 40 mg by mouth 2 (two) times daily.      Marland Kitchen lisinopril (PRINIVIL,ZESTRIL) 10 MG tablet Take 15 mg by mouth daily.      . potassium chloride SA (K-DUR,KLOR-CON) 20 MEQ tablet Take 20 mEq by mouth 2 (two) times daily.       No  current facility-administered medications for this encounter.    Filed Vitals:   01/03/13 1501  BP: 110/80  Pulse: 81  Height: 5\' 2"  (1.575 m)  Weight: 128 lb (58.06 kg)  SpO2: 100%   PHYSICAL EXAM: General: Well appearing. No respiratory difficulty  HEENT: normal  Neck: supple. no JVD. Carotids 2+ bilat; no bruits. No lymphadenopathy or thryomegaly appreciated.  Cor: PMI nondisplaced. Regular rate & rhythm. No rubs, gallops or murmurs.  Lungs: clear  Abdomen: soft, nontender, nondistended. No hepatosplenomegaly. No bruits or masses. Good bowel sounds.  Extremities: no cyanosis, clubbing, rash, edema  Neuro: alert & oriented x 3, cranial nerves grossly intact. moves all 4 extremities w/o difficulty. Affect pleasant.   ASSESSMENT & PLAN:  1) Chronic systolic heart failure: Unknown etiology, never had ischemic work-up, EF 30-35% (01/2013) - Continues to have NYHA I symptoms and is exercising regularly with no limitations. She had repeat ECHO today and EF remains suppressed 30-35%. She does not meet criteria for ICD currently being NYHA I. Discussed with patient in depth >30 minutes the options she had such as an ischemic work-up which I suggested (Stress cMRI to assess coronaries and quantify EF), CPX to determine how limited she is by her HF, or titrate of medications and repeat ECHO in  the future. We discussed each medication and the benefits they provided. She would like to try just continued titration of meds since she does not feel bad and will think about ischemic work-up. - Will increase lisinopril to 10 mg BID. Will get repeat BMET in 7-10 days. Instructed to call if she notices any dizziness. - Continue BB and dig at current doses. - Volume status stable on no diuretics. - Discussed what signs/symptoms to be concerned for HF and to call if she notices any weight gain, not able to perform usual activities without fatigue, any SOB or weight gain. 2) HTN  - stable; as above will  titrate lisinopril for LV depression effects and not for HTN.  F/U 6 weeks Aundria Rud 3:21 PM

## 2013-01-03 NOTE — Patient Instructions (Signed)
Increase lisinopril to 10 mg (1 tablet) twice a day. If you notice any dizziness go back to taking 15 mg (1 1/2 tablets) daily.  Next Friday Dec 12th go to [facility] office to get blood work.  Have a wonderful mountain trip and Christmas.  Follow up in 4-6 weeks.  Do the following things EVERYDAY: 1) Weigh yourself in the morning before breakfast. Write it down and keep it in a log. 2) Take your medicines as prescribed 3) Eat low salt foods-Limit salt (sodium) to 2000 mg per day.  4) Stay as active as you can everyday 5) Limit all fluids for the day to less than 2 liters 6)

## 2013-01-03 NOTE — Progress Notes (Signed)
*  PRELIMINARY RESULTS* Echocardiogram 2D Echocardiogram has been performed.  Diana Bird 01/03/2013, 2:59 PM

## 2013-01-30 NOTE — Addendum Note (Signed)
Encounter addended by: Noralee Space, RN on: 01/30/2013  1:46 PM<BR>     Documentation filed: Visit Diagnoses

## 2013-01-31 NOTE — Addendum Note (Signed)
Encounter addended by: Aundria Rud, NP on: 01/31/2013  2:17 PM<BR>     Documentation filed: Visit Diagnoses

## 2013-01-31 NOTE — Addendum Note (Signed)
Encounter addended by: Dolores Patty, MD on: 01/31/2013  2:13 PM<BR>     Documentation filed: Visit Diagnoses

## 2013-03-08 ENCOUNTER — Encounter (HOSPITAL_COMMUNITY): Payer: Self-pay | Admitting: Cardiology

## 2013-03-08 ENCOUNTER — Telehealth (HOSPITAL_COMMUNITY): Payer: Self-pay | Admitting: Cardiology

## 2013-03-08 NOTE — Telephone Encounter (Signed)
Attempting to contact pt to schedule follow up I have been unable to reach this patient by phone.  A letter is being sent to the last known home address.  

## 2013-07-16 ENCOUNTER — Other Ambulatory Visit: Payer: Self-pay | Admitting: Obstetrics and Gynecology

## 2013-07-16 ENCOUNTER — Other Ambulatory Visit (HOSPITAL_COMMUNITY)
Admission: RE | Admit: 2013-07-16 | Discharge: 2013-07-16 | Disposition: A | Discharge status: Home / Self Care | Payer: BC Managed Care – PPO | Source: Ambulatory Visit | Attending: Obstetrics and Gynecology | Admitting: Obstetrics and Gynecology

## 2013-07-16 DIAGNOSIS — Z1151 Encounter for screening for human papillomavirus (HPV): Secondary | ICD-10-CM | POA: Insufficient documentation

## 2013-07-16 DIAGNOSIS — Z01419 Encounter for gynecological examination (general) (routine) without abnormal findings: Secondary | ICD-10-CM | POA: Insufficient documentation

## 2013-07-19 LAB — CYTOLOGY - PAP

## 2014-02-06 ENCOUNTER — Emergency Department (HOSPITAL_COMMUNITY)
Admission: EM | Admit: 2014-02-06 | Discharge: 2014-02-06 | Disposition: A | Discharge status: Home / Self Care | Payer: BLUE CROSS/BLUE SHIELD | Attending: Emergency Medicine | Admitting: Emergency Medicine

## 2014-02-06 ENCOUNTER — Encounter (HOSPITAL_COMMUNITY): Payer: Self-pay | Admitting: *Deleted

## 2014-02-06 DIAGNOSIS — I5022 Chronic systolic (congestive) heart failure: Secondary | ICD-10-CM | POA: Diagnosis not present

## 2014-02-06 DIAGNOSIS — R112 Nausea with vomiting, unspecified: Secondary | ICD-10-CM

## 2014-02-06 DIAGNOSIS — Z79899 Other long term (current) drug therapy: Secondary | ICD-10-CM | POA: Insufficient documentation

## 2014-02-06 DIAGNOSIS — R531 Weakness: Secondary | ICD-10-CM | POA: Insufficient documentation

## 2014-02-06 DIAGNOSIS — I10 Essential (primary) hypertension: Secondary | ICD-10-CM | POA: Diagnosis not present

## 2014-02-06 LAB — CBC WITH DIFFERENTIAL/PLATELET
Basophils Absolute: 0 10*3/uL (ref 0.0–0.1)
Basophils Relative: 1 % (ref 0–1)
EOS ABS: 0 10*3/uL (ref 0.0–0.7)
Eosinophils Relative: 0 % (ref 0–5)
HEMATOCRIT: 35.9 % — AB (ref 36.0–46.0)
HEMOGLOBIN: 11.5 g/dL — AB (ref 12.0–15.0)
LYMPHS ABS: 2 10*3/uL (ref 0.7–4.0)
Lymphocytes Relative: 40 % (ref 12–46)
MCH: 27.8 pg (ref 26.0–34.0)
MCHC: 32 g/dL (ref 30.0–36.0)
MCV: 86.9 fL (ref 78.0–100.0)
MONOS PCT: 12 % (ref 3–12)
Monocytes Absolute: 0.6 10*3/uL (ref 0.1–1.0)
NEUTROS PCT: 47 % (ref 43–77)
Neutro Abs: 2.4 10*3/uL (ref 1.7–7.7)
PLATELETS: 256 10*3/uL (ref 150–400)
RBC: 4.13 MIL/uL (ref 3.87–5.11)
RDW: 15.3 % (ref 11.5–15.5)
WBC: 5.1 10*3/uL (ref 4.0–10.5)

## 2014-02-06 LAB — COMPREHENSIVE METABOLIC PANEL
ALT: 46 U/L — ABNORMAL HIGH (ref 0–35)
AST: 32 U/L (ref 0–37)
Albumin: 3.7 g/dL (ref 3.5–5.2)
Alkaline Phosphatase: 76 U/L (ref 39–117)
Anion gap: 8 (ref 5–15)
BUN: 13 mg/dL (ref 6–23)
CO2: 23 mmol/L (ref 19–32)
CREATININE: 0.98 mg/dL (ref 0.50–1.10)
Calcium: 8.9 mg/dL (ref 8.4–10.5)
Chloride: 109 mEq/L (ref 96–112)
GFR calc Af Amer: 74 mL/min — ABNORMAL LOW (ref >90)
GFR calc non Af Amer: 64 mL/min — ABNORMAL LOW (ref >90)
GLUCOSE: 122 mg/dL — AB (ref 70–99)
Potassium: 3.6 mmol/L (ref 3.5–5.1)
SODIUM: 140 mmol/L (ref 135–145)
TOTAL PROTEIN: 6.8 g/dL (ref 6.0–8.3)
Total Bilirubin: 0.8 mg/dL (ref 0.3–1.2)

## 2014-02-06 LAB — URINALYSIS, ROUTINE W REFLEX MICROSCOPIC
Glucose, UA: NEGATIVE mg/dL
KETONES UR: 15 mg/dL — AB
Leukocytes, UA: NEGATIVE
Nitrite: NEGATIVE
Protein, ur: 100 mg/dL — AB
Specific Gravity, Urine: 1.035 — ABNORMAL HIGH (ref 1.005–1.030)
UROBILINOGEN UA: 0.2 mg/dL (ref 0.0–1.0)
pH: 5 (ref 5.0–8.0)

## 2014-02-06 LAB — URINE MICROSCOPIC-ADD ON

## 2014-02-06 LAB — LIPASE, BLOOD: Lipase: 21 U/L (ref 11–59)

## 2014-02-06 MED ORDER — ONDANSETRON HCL 4 MG PO TABS: 4.0000 mg | ORAL_TABLET | Freq: Four times a day (QID) | ORAL | Status: DC

## 2014-02-06 MED ORDER — DIPHENHYDRAMINE HCL 50 MG/ML IJ SOLN
25.0000 mg | Freq: Once | INTRAMUSCULAR | Status: AC
  Administered 2014-02-06: 25 mg via INTRAVENOUS
  Filled 2014-02-06: qty 1

## 2014-02-06 MED ORDER — ONDANSETRON HCL 4 MG/2ML IJ SOLN
4.0000 mg | Freq: Once | INTRAMUSCULAR | Status: AC
  Administered 2014-02-06: 4 mg via INTRAVENOUS
  Filled 2014-02-06: qty 2

## 2014-02-06 MED ORDER — METOCLOPRAMIDE HCL 5 MG/ML IJ SOLN
10.0000 mg | Freq: Once | INTRAMUSCULAR | Status: AC
  Administered 2014-02-06: 10 mg via INTRAVENOUS
  Filled 2014-02-06: qty 2

## 2014-02-06 MED ORDER — SODIUM CHLORIDE 0.9 % IV BOLUS (SEPSIS)
500.0000 mL | Freq: Once | INTRAVENOUS | Status: AC
  Administered 2014-02-06: 500 mL via INTRAVENOUS

## 2014-02-06 MED ORDER — SODIUM CHLORIDE 0.9 % IV BOLUS (SEPSIS): 1000.0000 mL | Freq: Once | INTRAVENOUS | Status: DC

## 2014-02-06 NOTE — ED Notes (Signed)
Pt in c/o n/v/d for the last three days, states she is unable to keep any food or fluid down, generalized fatigue and body aches, denies fever

## 2014-02-06 NOTE — Discharge Instructions (Signed)

## 2014-02-06 NOTE — ED Provider Notes (Signed)
CSN: [id number]     Arrival date & time 02/06/14  1033 History   First MD Initiated Contact with Patient 02/06/14 1105     Chief Complaint  Patient presents with  . N/V/D      (Consider location/radiation/quality/duration/timing/severity/associated sxs/prior Treatment) HPI Comments: Presents to the ER for evaluation of nausea and vomiting for 3 days. Patient reports that she has not been able to eat or drink anything because of the symptoms. She has had increased number of bowel movements, but they have not been loose or watery. She reports severe nausea but no abdominal pain. She has not had any fever. She feels very weak. She does report that she visited a friend and someone at that home had similar symptoms.   Past Medical History  Diagnosis Date  . Chronic systolic heart failure   . Hypertension    History reviewed. No pertinent past surgical history. Family History  Problem Relation Age of Onset  . Heart failure Mother    History  Substance Use Topics  . Smoking status: Never Smoker   . Smokeless tobacco: Not on file  . Alcohol Use: Yes     Comment: 1-2 glasses of wine every other night   OB History    No data available     Review of Systems  Gastrointestinal: Positive for nausea and vomiting.  All other systems reviewed and are negative.     Allergies  Review of patient's allergies indicates no known allergies.  Home Medications   Prior to Admission medications   Medication Sig Start Date End Date Taking? Authorizing Provider  Calcium Carb-Cholecalciferol (CALCIUM-VITAMIN D) 600-400 MG-UNIT TABS Take 1 tablet by mouth daily.   Yes Historical Provider, MD  carvedilol (COREG) 6.25 MG tablet Take 1.5 tablets (9.375 mg total) by mouth 2 (two) times daily with a meal. 12/04/12  Yes Rande Brunt, NP  digoxin (LANOXIN) 0.125 MG tablet Take 0.125 mg by mouth daily.   Yes Historical Provider, MD  ferrous sulfate 325 (65 FE) MG tablet Take 325 mg by mouth daily with  breakfast.    Yes Historical Provider, MD  furosemide (LASIX) 40 MG tablet Take 40 mg by mouth 2 (two) times daily.   Yes Historical Provider, MD  lisinopril (PRINIVIL,ZESTRIL) 10 MG tablet Take 1 tablet (10 mg total) by mouth 2 (two) times daily. 01/03/13  Yes Rande Brunt, NP  potassium chloride SA (K-DUR,KLOR-CON) 20 MEQ tablet Take 20 mEq by mouth daily.    Yes Historical Provider, MD   BP 115/85 mmHg  Pulse 91  Temp(Src) 97.7 F (36.5 C) (Oral)  Resp 19  SpO2 93% Physical Exam  Constitutional: She is oriented to person, place, and time. She appears well-developed and well-nourished. No distress.  HENT:  Head: Normocephalic and atraumatic.  Right Ear: Hearing normal.  Left Ear: Hearing normal.  Nose: Nose normal.  Mouth/Throat: Oropharynx is clear and moist and mucous membranes are normal.  Eyes: Conjunctivae and EOM are normal. Pupils are equal, round, and reactive to light.  Neck: Normal range of motion. Neck supple.  Cardiovascular: Regular rhythm, S1 normal and S2 normal.  Exam reveals no gallop and no friction rub.   No murmur heard. Pulmonary/Chest: Effort normal and breath sounds normal. No respiratory distress. She exhibits no tenderness.  Abdominal: Soft. Normal appearance and bowel sounds are normal. There is no hepatosplenomegaly. There is no tenderness. There is no rebound, no guarding, no tenderness at McBurney's point and negative Murphy's sign. No hernia.  Musculoskeletal: Normal range of motion.  Neurological: She is alert and oriented to person, place, and time. She has normal strength. No cranial nerve deficit or sensory deficit. Coordination normal. GCS eye subscore is 4. GCS verbal subscore is 5. GCS motor subscore is 6.  Skin: Skin is warm, dry and intact. No rash noted. No cyanosis.  Psychiatric: She has a normal mood and affect. Her speech is normal and behavior is normal. Thought content normal.  Nursing note and vitals reviewed.   ED Course  Procedures  (including critical care time) Labs Review Labs Reviewed  CBC WITH DIFFERENTIAL - Abnormal; Notable for the following:    Hemoglobin 11.5 (*)    HCT 35.9 (*)    All other components within normal limits  COMPREHENSIVE METABOLIC PANEL - Abnormal; Notable for the following:    Glucose, Bld 122 (*)    ALT 46 (*)    GFR calc non Af Amer 64 (*)    GFR calc Af Amer 74 (*)    All other components within normal limits  URINALYSIS, ROUTINE W REFLEX MICROSCOPIC - Abnormal; Notable for the following:    Color, Urine AMBER (*)    Specific Gravity, Urine 1.035 (*)    Hgb urine dipstick SMALL (*)    Bilirubin Urine SMALL (*)    Ketones, ur 15 (*)    Protein, ur 100 (*)    All other components within normal limits  URINE MICROSCOPIC-ADD ON - Abnormal; Notable for the following:    Squamous Epithelial / LPF FEW (*)    Casts HYALINE CASTS (*)    All other components within normal limits  LIPASE, BLOOD    Imaging Review No results found.   EKG Interpretation None      MDM   Final diagnoses:  None   nausea vomiting  Presents to the ER for evaluation of nausea and vomiting with abdominal cramping has been ongoing for several days. Patient has benign, nontender abdominal exam. She has significantly improved with IV fluids and medications. Lab work is unremarkable. Patient is appropriate for discharge, symptomatically treatment. Return if symptoms worsen.    Orpah Greek, MD 02/06/14 [phone]

## 2014-02-08 ENCOUNTER — Other Ambulatory Visit (HOSPITAL_COMMUNITY): Payer: Self-pay | Admitting: Anesthesiology

## 2014-02-11 ENCOUNTER — Encounter (HOSPITAL_COMMUNITY): Payer: Self-pay | Admitting: *Deleted

## 2014-02-11 ENCOUNTER — Emergency Department (HOSPITAL_COMMUNITY)
Admission: EM | Admit: 2014-02-11 | Discharge: 2014-02-11 | Disposition: A | Discharge status: Home / Self Care | Payer: BC Managed Care – PPO | Attending: Emergency Medicine | Admitting: Emergency Medicine

## 2014-02-11 ENCOUNTER — Emergency Department (HOSPITAL_COMMUNITY): Payer: BC Managed Care – PPO

## 2014-02-11 ENCOUNTER — Emergency Department (INDEPENDENT_AMBULATORY_CARE_PROVIDER_SITE_OTHER)
Admission: EM | Admit: 2014-02-11 | Discharge: 2014-02-11 | Disposition: A | Discharge status: Other Acute Inpatient Hospital | Payer: BLUE CROSS/BLUE SHIELD | Source: Home / Self Care | Attending: Emergency Medicine | Admitting: Emergency Medicine

## 2014-02-11 ENCOUNTER — Encounter (HOSPITAL_COMMUNITY): Payer: Self-pay

## 2014-02-11 DIAGNOSIS — Z79899 Other long term (current) drug therapy: Secondary | ICD-10-CM | POA: Insufficient documentation

## 2014-02-11 DIAGNOSIS — I10 Essential (primary) hypertension: Secondary | ICD-10-CM | POA: Diagnosis not present

## 2014-02-11 DIAGNOSIS — R079 Chest pain, unspecified: Secondary | ICD-10-CM | POA: Insufficient documentation

## 2014-02-11 DIAGNOSIS — I5022 Chronic systolic (congestive) heart failure: Secondary | ICD-10-CM | POA: Insufficient documentation

## 2014-02-11 DIAGNOSIS — R0602 Shortness of breath: Secondary | ICD-10-CM | POA: Diagnosis present

## 2014-02-11 DIAGNOSIS — R5383 Other fatigue: Secondary | ICD-10-CM

## 2014-02-11 DIAGNOSIS — R531 Weakness: Secondary | ICD-10-CM

## 2014-02-11 DIAGNOSIS — R9431 Abnormal electrocardiogram [ECG] [EKG]: Secondary | ICD-10-CM | POA: Insufficient documentation

## 2014-02-11 DIAGNOSIS — J189 Pneumonia, unspecified organism: Secondary | ICD-10-CM

## 2014-02-11 DIAGNOSIS — R0600 Dyspnea, unspecified: Secondary | ICD-10-CM

## 2014-02-11 DIAGNOSIS — J159 Unspecified bacterial pneumonia: Secondary | ICD-10-CM | POA: Diagnosis not present

## 2014-02-11 DIAGNOSIS — R1110 Vomiting, unspecified: Secondary | ICD-10-CM

## 2014-02-11 DIAGNOSIS — R5381 Other malaise: Secondary | ICD-10-CM

## 2014-02-11 LAB — CBC WITH DIFFERENTIAL/PLATELET
Basophils Absolute: 0 10*3/uL (ref 0.0–0.1)
Basophils Relative: 0 % (ref 0–1)
EOS ABS: 0 10*3/uL (ref 0.0–0.7)
EOS PCT: 0 % (ref 0–5)
HCT: 36.4 % (ref 36.0–46.0)
Hemoglobin: 11.7 g/dL — ABNORMAL LOW (ref 12.0–15.0)
Lymphocytes Relative: 29 % (ref 12–46)
Lymphs Abs: 1.3 10*3/uL (ref 0.7–4.0)
MCH: 27.9 pg (ref 26.0–34.0)
MCHC: 32.1 g/dL (ref 30.0–36.0)
MCV: 86.9 fL (ref 78.0–100.0)
MONOS PCT: 14 % — AB (ref 3–12)
Monocytes Absolute: 0.6 10*3/uL (ref 0.1–1.0)
Neutro Abs: 2.5 10*3/uL (ref 1.7–7.7)
Neutrophils Relative %: 57 % (ref 43–77)
Platelets: 225 10*3/uL (ref 150–400)
RBC: 4.19 MIL/uL (ref 3.87–5.11)
RDW: 15.4 % (ref 11.5–15.5)
WBC: 4.5 10*3/uL (ref 4.0–10.5)

## 2014-02-11 LAB — LIPASE, BLOOD: LIPASE: 23 U/L (ref 11–59)

## 2014-02-11 LAB — COMPREHENSIVE METABOLIC PANEL
ALBUMIN: 3.6 g/dL (ref 3.5–5.2)
ALT: 62 U/L — ABNORMAL HIGH (ref 0–35)
AST: 36 U/L (ref 0–37)
Alkaline Phosphatase: 72 U/L (ref 39–117)
Anion gap: 5 (ref 5–15)
BUN: 9 mg/dL (ref 6–23)
CALCIUM: 8.7 mg/dL (ref 8.4–10.5)
CHLORIDE: 108 meq/L (ref 96–112)
CO2: 24 mmol/L (ref 19–32)
Creatinine, Ser: 0.83 mg/dL (ref 0.50–1.10)
GFR calc Af Amer: >90 mL/min (ref >90)
GFR calc non Af Amer: 78 mL/min — ABNORMAL LOW (ref >90)
Glucose, Bld: 119 mg/dL — ABNORMAL HIGH (ref 70–99)
Potassium: 3.7 mmol/L (ref 3.5–5.1)
SODIUM: 137 mmol/L (ref 135–145)
Total Bilirubin: 0.9 mg/dL (ref 0.3–1.2)
Total Protein: 6.6 g/dL (ref 6.0–8.3)

## 2014-02-11 LAB — DIGOXIN LEVEL: Digoxin Level: <0.2 ng/mL — ABNORMAL LOW (ref 0.8–2.0)

## 2014-02-11 LAB — I-STAT TROPONIN, ED: Troponin i, poc: 0 ng/mL (ref 0.00–0.08)

## 2014-02-11 MED ORDER — ONDANSETRON HCL 4 MG/2ML IJ SOLN: 4.0000 mg | Freq: Once | INTRAMUSCULAR | Status: DC

## 2014-02-11 MED ORDER — ONDANSETRON HCL 4 MG PO TABS: 4.0000 mg | ORAL_TABLET | Freq: Four times a day (QID) | ORAL | Status: DC

## 2014-02-11 MED ORDER — SODIUM CHLORIDE 0.9 % IV SOLN: Freq: Once | INTRAVENOUS | Status: DC

## 2014-02-11 MED ORDER — ONDANSETRON HCL 4 MG/2ML IJ SOLN
4.0000 mg | Freq: Once | INTRAMUSCULAR | Status: AC
  Administered 2014-02-11: 4 mg via INTRAVENOUS

## 2014-02-11 MED ORDER — ONDANSETRON HCL 4 MG/2ML IJ SOLN
4.0000 mg | Freq: Once | INTRAMUSCULAR | Status: AC
  Administered 2014-02-11: 4 mg via INTRAVENOUS
  Filled 2014-02-11: qty 2

## 2014-02-11 MED ORDER — SODIUM CHLORIDE 0.9 % IV SOLN
Freq: Once | INTRAVENOUS | Status: AC
  Administered 2014-02-11: 15:00:00 via INTRAVENOUS

## 2014-02-11 MED ORDER — LEVOFLOXACIN 500 MG PO TABS: 500.0000 mg | ORAL_TABLET | Freq: Every day | ORAL | Status: DC

## 2014-02-11 MED ORDER — ONDANSETRON HCL 4 MG/2ML IJ SOLN
INTRAMUSCULAR | Status: AC
Start: 2014-02-11 — End: 2014-02-11
  Filled 2014-02-11: qty 2

## 2014-02-11 NOTE — ED Provider Notes (Signed)
CSN: [id number]     Arrival date & time 02/11/14  1509 History   First MD Initiated Contact with Patient 02/11/14 1520     Chief Complaint  Patient presents with  . Shortness of Breath  . Abnormal ECG    inverted t waves (lateral leads)     (Consider location/radiation/quality/duration/timing/severity/associated sxs/prior Treatment) HPI  Diana Bird is a 54 y.o. female with PMH of systolic heart failure, hypertension presenting with 2-3 days of shortness of breath worse with ambulation. Patient denies any chest pain. She denies any fevers or chills but has had increased sputum productive and that is thick and non-collared for the past 3 days as well. She also endorses congestion and sore throat. She has had nausea, vomiting, diarrhea. For the past 4-5 days has been evaluated. No abdominal pain and she endorses one episode of nonbloody emesis today but has not had any diarrhea and the symptoms are resolving. Patient reports taking 40 minutes Lasix twice daily and took her dose today. She denies any leg swelling. She states she does sleep on 2 pillows. Her cardiologist is Dr. Ronna Polio.   Past Medical History  Diagnosis Date  . Chronic systolic heart failure   . Hypertension    History reviewed. No pertinent past surgical history. Family History  Problem Relation Age of Onset  . Heart failure Mother    History  Substance Use Topics  . Smoking status: Never Smoker   . Smokeless tobacco: Not on file  . Alcohol Use: Yes     Comment: 1-2 glasses of wine every other night   OB History    No data available     Review of Systems 10 Systems reviewed and are negative for acute change except as noted in the HPI.    Allergies  Review of patient's allergies indicates no known allergies.  Home Medications   Prior to Admission medications   Medication Sig Start Date End Date Taking? Authorizing Provider  Calcium Carb-Cholecalciferol (CALCIUM-VITAMIN D) 600-400 MG-UNIT TABS  Take 1 tablet by mouth daily.    Historical Provider, MD  carvedilol (COREG) 6.25 MG tablet Take 1.5 tablets (9.375 mg total) by mouth 2 (two) times daily with a meal. 12/04/12   Rande Brunt, NP  digoxin (LANOXIN) 0.125 MG tablet Take 0.125 mg by mouth daily.    Historical Provider, MD  ferrous sulfate 325 (65 FE) MG tablet Take 325 mg by mouth daily with breakfast.     Historical Provider, MD  furosemide (LASIX) 40 MG tablet Take 40 mg by mouth 2 (two) times daily.    Historical Provider, MD  levofloxacin (LEVAQUIN) 500 MG tablet Take 1 tablet (500 mg total) by mouth daily. 02/11/14   Pura Spice, PA-C  lisinopril (PRINIVIL,ZESTRIL) 10 MG tablet TAKE 1 TABLET BY MOUTH TWICE DAILY 02/08/14   Larey Dresser, MD  ondansetron (ZOFRAN) 4 MG tablet Take 1 tablet (4 mg total) by mouth every 6 (six) hours. 02/06/14   Orpah Greek, MD  ondansetron (ZOFRAN) 4 MG tablet Take 1 tablet (4 mg total) by mouth every 6 (six) hours. 02/11/14   Pura Spice, PA-C  potassium chloride SA (K-DUR,KLOR-CON) 20 MEQ tablet Take 20 mEq by mouth daily.     Historical Provider, MD   BP 125/90 mmHg  Pulse 100  Temp(Src) 97.9 F (36.6 C) (Oral)  Resp 19  SpO2 99% Physical Exam  Constitutional: She appears well-developed and well-nourished. No distress.  HENT:  Head: Normocephalic and atraumatic.  Eyes: Conjunctivae and EOM are normal. Right eye exhibits no discharge. Left eye exhibits no discharge.  Neck:  Elevated venous pulsations without frank JVD.  Cardiovascular: Normal rate, regular rhythm and normal heart sounds.   No leg swelling or tenderness.   Pulmonary/Chest: Effort normal and breath sounds normal. No respiratory distress. She has no wheezes.  Abdominal: Soft. Bowel sounds are normal. She exhibits no distension. There is no tenderness.  Neurological: She is alert. She exhibits normal muscle tone. Coordination normal.  Skin: Skin is warm and dry. She is not diaphoretic.  Nursing note and  vitals reviewed.   ED Course  Procedures (including critical care time) Labs Review Labs Reviewed  CBC WITH DIFFERENTIAL - Abnormal; Notable for the following:    Hemoglobin 11.7 (*)    Monocytes Relative 14 (*)    All other components within normal limits  COMPREHENSIVE METABOLIC PANEL - Abnormal; Notable for the following:    Glucose, Bld 119 (*)    ALT 62 (*)    GFR calc non Af Amer 78 (*)    All other components within normal limits  LIPASE, BLOOD  DIGOXIN LEVEL  I-STAT TROPOININ, ED    Imaging Review Dg Chest 2 View  02/11/2014   CLINICAL DATA:  A stomach virus last week. Weakness, nausea, vomiting. Chest pain and shortness of breath. Congestion.  EXAM: CHEST  2 VIEW  COMPARISON:  02/18/2009, chest CT 02/18/2009  FINDINGS: The heart is mildly enlarged. There is dense opacity at the left lung base consistent with atelectasis or infiltrate and pleural effusion. There is no pulmonary edema.  IMPRESSION: 1. Cardiomegaly without pulmonary edema. 2. Left lower lobe infiltrate/atelectasis and effusion.   Electronically Signed   By: Shon Hale M.D.   On: 02/11/2014 16:36   Dg Abd 2 Views  02/11/2014   CLINICAL DATA:  Weakness, nausea, vomiting. Chest pain, shortness of breath and congestion.  EXAM: ABDOMEN - 2 VIEW  COMPARISON:  None.  FINDINGS: The bowel gas pattern is normal. There is no evidence of free air. No radio-opaque calculi or other significant radiographic abnormality is seen.  IMPRESSION: Negative.   Electronically Signed   By: Rolm Baptise M.D.   On: 02/11/2014 16:37     EKG Interpretation   Date/Time:  Monday February 11 2014 15:20:10 EST Ventricular Rate:  99 PR Interval:  108 QRS Duration: 87 QT Interval:  478 QTC Calculation: 614 R Axis:   -73 Text Interpretation:  Sinus or ectopic atrial rhythm Probable left atrial  enlargement Left anterior fascicular block Probable anterior infarct, age  indeterminate Lateral leads are also involved Prolonged QT interval  Since  previous tracing , t wave inversions are unchanged since 2011 Confirmed by  [facility]  MD, [place] [phone]) on 02/11/2014 3:30:57 PM      MDM   Final diagnoses:  Chest pain  CAP (community acquired pneumonia)   A shunt presenting to the ED with 2-3 days of worsening shortness of breath with associated productive cough. She has also had GI viral syndrome. She was seen in urgent care today and sent over for her shortness of breath as well as inverted T waves which are unchanged since 2011. Troponin negative. Patient afebrile in ED with mild tachycardia that has resolved and no hypoxia. Patient without evidence of fluid overload to suggest congestive heart failure. Chest x-ray with evidence of left lower lobe infiltrate. We'll treat with Levaquin. Pt advised to present to ED or PCP for achilles tendon pain and decrease  activity. Patient advised follow-up with primary care provider in 2 days.  Discussed return precautions with patient. Discussed all results and patient verbalizes understanding and agrees with plan.  This is a shared patient. This patient was discussed with the physician, Dr. Regenia Skeeter who saw and evaluated the patient and agrees with the plan.   Pura Spice, PA-C 02/11/14 [place], MD 02/11/14 [phone]

## 2014-02-11 NOTE — ED Notes (Signed)
Pt   Placed  On  Cardiac  Monitor  And  Nasal    02    AT  2  L  /  MIN

## 2014-02-11 NOTE — ED Notes (Addendum)
Pt  Reports   Symptoms   Of  Shortness  Of  Breath           Of  Breath      Weakness          Recently in  Er       sev  Days  Ago         Pt      Also  Reports      Nausea  And  Vomiting          Skin  Is  Warm  And  Dry

## 2014-02-11 NOTE — ED Notes (Signed)
Per EMS, patient went to urgent care today with exertional SOB that began last night and noticed she had inverted t waves in lateral leads. Pt. Was seen last week for GI problems and states she is still recovering from that. Pt. Is alert and oriented x4.

## 2014-02-11 NOTE — ED Provider Notes (Signed)
CSN: [id number]     Arrival date & time 02/11/14  1323 History   First MD Initiated Contact with Patient 02/11/14 1403     Chief Complaint  Patient presents with  . Shortness of Breath   (Consider location/radiation/quality/duration/timing/severity/associated sxs/prior Treatment) HPI Comments: Presented to the ER for evaluation of nausea and vomiting for 3 days on 02/06/2014. Patient reported that she has not been able to eat or drink anything because of the symptoms and this has persisted since symptoms began. She has had increased number of bowel movements, but they have not been loose or watery. She reports severe nausea and diffuse cramping abdominal pain. She has not had any fever. She feels very weak. She states she is too weak to sit, stand, walk or take care of herself at home.  Reports over the past 24 hours she has developed increased work of breathing without associated chest pain or fever.  PCP: Dr. Delfina Redwood  Patient is a 54 y.o. female presenting with shortness of breath.  Shortness of Breath Associated symptoms: abdominal pain and vomiting   Associated symptoms: no chest pain and no cough     Past Medical History  Diagnosis Date  . Chronic systolic heart failure   . Hypertension    No past surgical history on file. Family History  Problem Relation Age of Onset  . Heart failure Mother    History  Substance Use Topics  . Smoking status: Never Smoker   . Smokeless tobacco: Not on file  . Alcohol Use: Yes     Comment: 1-2 glasses of wine every other night   OB History    No data available     Review of Systems  Constitutional: Positive for appetite change and fatigue.  HENT: Negative.   Eyes: Negative.   Respiratory: Positive for shortness of breath. Negative for cough and chest tightness.   Cardiovascular: Negative for chest pain, palpitations and leg swelling.  Gastrointestinal: Positive for nausea, vomiting and abdominal pain. Negative for diarrhea.   Genitourinary: Negative for dysuria, frequency, hematuria and flank pain.  Musculoskeletal: Negative for back pain.  Skin: Negative.   Neurological: Positive for weakness and light-headedness.    Allergies  Review of patient's allergies indicates no known allergies.  Home Medications   Prior to Admission medications   Medication Sig Start Date End Date Taking? Authorizing Provider  Calcium Carb-Cholecalciferol (CALCIUM-VITAMIN D) 600-400 MG-UNIT TABS Take 1 tablet by mouth daily.    Historical Provider, MD  carvedilol (COREG) 6.25 MG tablet Take 1.5 tablets (9.375 mg total) by mouth 2 (two) times daily with a meal. 12/04/12   Rande Brunt, NP  digoxin (LANOXIN) 0.125 MG tablet Take 0.125 mg by mouth daily.    Historical Provider, MD  ferrous sulfate 325 (65 FE) MG tablet Take 325 mg by mouth daily with breakfast.     Historical Provider, MD  furosemide (LASIX) 40 MG tablet Take 40 mg by mouth 2 (two) times daily.    Historical Provider, MD  lisinopril (PRINIVIL,ZESTRIL) 10 MG tablet TAKE 1 TABLET BY MOUTH TWICE DAILY 02/08/14   Larey Dresser, MD  ondansetron (ZOFRAN) 4 MG tablet Take 1 tablet (4 mg total) by mouth every 6 (six) hours. 02/06/14   Orpah Greek, MD  potassium chloride SA (K-DUR,KLOR-CON) 20 MEQ tablet Take 20 mEq by mouth daily.     Historical Provider, MD   BP 133/87 mmHg  Pulse 106  Temp(Src) 98.1 F (36.7 C) (Oral)  Resp 18  SpO2 99% Physical Exam  Constitutional: She is oriented to person, place, and time. She appears well-developed and well-nourished.  HENT:  Head: Normocephalic and atraumatic.  Mouth/Throat: Oropharynx is clear and moist.  Eyes: Conjunctivae are normal. No scleral icterus.  Neck: Normal range of motion and full passive range of motion without pain. Neck supple.  Cardiovascular: Regular rhythm and normal heart sounds.   +mild tachycardia  Pulmonary/Chest: Effort normal and breath sounds normal. No respiratory distress. She has no  wheezes.  Abdominal: Soft. Bowel sounds are decreased. There is no tenderness.  +mild distension  Musculoskeletal: Normal range of motion.  Neurological: She is alert and oriented to person, place, and time.  Skin: Skin is warm and dry. No rash noted. No erythema.  Psychiatric: She has a normal mood and affect. Her behavior is normal.  Nursing note and vitals reviewed.   ED Course  Procedures (including critical care time) Labs Review Labs Reviewed - No data to display  Imaging Review No results found.   MDM   1. Dyspnea   2. Intractable vomiting with nausea, vomiting of unspecified type   3. Weakness   4. Malaise and fatigue     No hypoxia or clinical evidence of respiratory failure. No hypotension or fever. Placed on cardiac monitor, IV NS @ 75cc/hr placed, IV zofran 4mg  given. ECG: Sinus tachycardia at 105 bpm with LA deviation and T wave inversions in lateral leads. No previous tracing available for comparison.  To be transferred to [facility] via EMS for further evaluation.       Lutricia Feil, [place] 02/11/14 [phone]

## 2014-02-11 NOTE — Discharge Instructions (Signed)
Return to the emergency room with worsening of symptoms, new symptoms or with symptoms that are concerning, especially high fevers not controlled with Tylenol, vomiting blood, blood in your stool, severe abdominal pain or chest pain or worsening SOB. Follow-up with your primary care provider in 2 days. Please take all of your antibiotics until finished!   You may develop abdominal discomfort or diarrhea from the antibiotic.  You may help offset this with probiotics which you can buy or get in yogurt. Do not eat  or take the probiotics until 2 hours after your antibiotic.  Read all information below and follow recommendations.  Pneumonia Pneumonia is an infection of the lungs.  CAUSES Pneumonia may be caused by bacteria or a virus. Usually, these infections are caused by breathing infectious particles into the lungs (respiratory tract). SIGNS AND SYMPTOMS   Cough.  Fever.  Chest pain.  Increased rate of breathing.  Wheezing.  Mucus production. DIAGNOSIS  If you have the common symptoms of pneumonia, your health care provider will typically confirm the diagnosis with a chest X-ray. The X-ray will show an abnormality in the lung (pulmonary infiltrate) if you have pneumonia. Other tests of your blood, urine, or sputum may be done to find the specific cause of your pneumonia. Your health care provider may also do tests (blood gases or pulse oximetry) to see how well your lungs are working. TREATMENT  Some forms of pneumonia may be spread to other people when you cough or sneeze. You may be asked to wear a mask before and during your exam. Pneumonia that is caused by bacteria is treated with antibiotic medicine. Pneumonia that is caused by the influenza virus may be treated with an antiviral medicine. Most other viral infections must run their course. These infections will not respond to antibiotics.  HOME CARE INSTRUCTIONS   Cough suppressants may be used if you are losing too much rest.  However, coughing protects you by clearing your lungs. You should avoid using cough suppressants if you can.  Your health care provider may have prescribed medicine if he or she thinks your pneumonia is caused by bacteria or influenza. Finish your medicine even if you start to feel better.  Your health care provider may also prescribe an expectorant. This loosens the mucus to be coughed up.  Take medicines only as directed by your health care provider.  Do not smoke. Smoking is a common cause of bronchitis and can contribute to pneumonia. If you are a smoker and continue to smoke, your cough may last several weeks after your pneumonia has cleared.  A cold steam vaporizer or humidifier in your room or home may help loosen mucus.  Coughing is often worse at night. Sleeping in a semi-upright position in a recliner or using a couple pillows under your head will help with this.  Get rest as you feel it is needed. Your body will usually let you know when you need to rest. PREVENTION A pneumococcal shot (vaccine) is available to prevent a common bacterial cause of pneumonia. This is usually suggested for:  People over 36 years old.  Patients on chemotherapy.  People with chronic lung problems, such as bronchitis or emphysema.  People with immune system problems. If you are over 65 or have a high risk condition, you may receive the pneumococcal vaccine if you have not received it before. In some countries, a routine influenza vaccine is also recommended. This vaccine can help prevent some cases of pneumonia.You may be offered  the influenza vaccine as part of your care. If you smoke, it is time to quit. You may receive instructions on how to stop smoking. Your health care provider can provide medicines and counseling to help you quit. SEEK MEDICAL CARE IF: You have a fever. SEEK IMMEDIATE MEDICAL CARE IF:   Your illness becomes worse. This is especially true if you are elderly or weakened from  any other disease.  You cannot control your cough with suppressants and are losing sleep.  You begin coughing up blood.  You develop pain which is getting worse or is uncontrolled with medicines.  Any of the symptoms which initially brought you in for treatment are getting worse rather than better.  You develop shortness of breath or chest pain. MAKE SURE YOU:   Understand these instructions.  Will watch your condition.  Will get help right away if you are not doing well or get worse. Document Released: 01/18/2005 Document Revised: 06/04/2013 Document Reviewed: 04/09/2010 [facility] Patient Information 2015 [place], [place]. This information is not intended to replace advice given to you by your health care provider. Make sure you discuss any questions you have with your health care provider.

## 2014-02-14 ENCOUNTER — Other Ambulatory Visit: Payer: Self-pay | Admitting: Internal Medicine

## 2014-02-14 ENCOUNTER — Ambulatory Visit
Admission: RE | Admit: 2014-02-14 | Discharge: 2014-02-14 | Disposition: A | Discharge status: Home / Self Care | Payer: BC Managed Care – PPO | Source: Ambulatory Visit | Attending: Internal Medicine | Admitting: Internal Medicine

## 2014-02-14 DIAGNOSIS — R109 Unspecified abdominal pain: Secondary | ICD-10-CM

## 2014-03-30 ENCOUNTER — Other Ambulatory Visit (HOSPITAL_COMMUNITY): Payer: Self-pay | Admitting: Cardiology

## 2014-05-06 ENCOUNTER — Other Ambulatory Visit (HOSPITAL_COMMUNITY): Payer: Self-pay | Admitting: Internal Medicine

## 2014-05-06 ENCOUNTER — Ambulatory Visit (HOSPITAL_COMMUNITY): Payer: BC Managed Care – PPO | Attending: Cardiology | Admitting: Radiology

## 2014-05-06 DIAGNOSIS — I5020 Unspecified systolic (congestive) heart failure: Secondary | ICD-10-CM

## 2014-05-06 DIAGNOSIS — I5022 Chronic systolic (congestive) heart failure: Secondary | ICD-10-CM

## 2014-05-06 NOTE — Progress Notes (Signed)
Echocardiogram performed.  

## 2014-06-24 ENCOUNTER — Ambulatory Visit (INDEPENDENT_AMBULATORY_CARE_PROVIDER_SITE_OTHER): Payer: BC Managed Care – PPO | Admitting: Cardiology

## 2014-06-24 ENCOUNTER — Encounter: Payer: Self-pay | Admitting: Cardiology

## 2014-06-24 VITALS — BP 110/80 | HR 80 | Ht 62.0 in | Wt 126.0 lb

## 2014-06-24 DIAGNOSIS — R0602 Shortness of breath: Secondary | ICD-10-CM | POA: Diagnosis not present

## 2014-06-24 DIAGNOSIS — I10 Essential (primary) hypertension: Secondary | ICD-10-CM | POA: Diagnosis not present

## 2014-06-24 DIAGNOSIS — I420 Dilated cardiomyopathy: Secondary | ICD-10-CM | POA: Diagnosis not present

## 2014-06-24 DIAGNOSIS — I5043 Acute on chronic combined systolic (congestive) and diastolic (congestive) heart failure: Secondary | ICD-10-CM | POA: Diagnosis not present

## 2014-06-24 MED ORDER — FUROSEMIDE 40 MG PO TABS: 80.0000 mg | ORAL_TABLET | Freq: Two times a day (BID) | ORAL | Status: DC

## 2014-06-24 NOTE — Progress Notes (Signed)
Cardiology Office Note   Date:  06/24/2014   ID:  Jasper Riling, DOB 1960-07-24, MRN [id number]  PCP:  Glori Bickers, MD  Cardiologist:   Sueanne Margarita, MD   Chief Complaint  Patient presents with  . New Evaluation    echo showed severe LV, chronic systolic heart failure      History of Present Illness: Diana Bird is a 54 y.o. female who presents for followup of chronic systolic CHF. She was placed on my scheduled but has been followed in the [facility] clinic.  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  She has a history of chronic anemia and family history of CHF with her mom passing at age 70 from CHF.  She was supposed to have a cardiac MRI done to look for possible sarcoidosis given her lymphadenopathy on chest CT but this does not appear to ever have been done.  Repeat echo a year and a half ago showed EF 25% with apical trabeculations and biventricular failure.  She recently saw Dr. Delfina Redwood and says that she has been sick since December with stomach illness and then URI.  Since then she has had constant diarrhea and nausea.  She has had LE edema since April when Dr. Haroldine Laws and was started on lasix which helps but then it reoccurs.  She says that she tries to follow a low sodium diet and does not eat a lot of sodium.  She has never had LE edema ever in the past.  She says that her SOB has actually improved after getting over the lung infection.  She was initially waking up with PND and having orthopnea when she had her URI but that has  improved. She still cannot lay down completely flat and has 2-3 pillow orthopnea.  She denies any chest pain or pressure.  She says that recently she has noticed some palpitations and her heart races.      Past Medical History  Diagnosis Date  . Chronic systolic heart failure   . Hypertension     Past Surgical History  Procedure  Laterality Date  . None       Current Outpatient Prescriptions  Medication Sig Dispense Refill  . Calcium Carb-Cholecalciferol (CALCIUM-VITAMIN D) 600-400 MG-UNIT TABS Take 1 tablet by mouth daily.    . carvedilol (COREG) 6.25 MG tablet Take 1.5 tablets (9.375 mg total) by mouth 2 (two) times daily with a meal. 90 tablet 6  . digoxin (LANOXIN) 0.125 MG tablet Take 0.125 mg by mouth daily.    . ferrous sulfate 325 (65 FE) MG tablet Take 325 mg by mouth daily with breakfast.     . furosemide (LASIX) 40 MG tablet Take 40 mg by mouth 2 (two) times daily.    Marland Kitchen lisinopril (PRINIVIL,ZESTRIL) 10 MG tablet TAKE 1 TABLET BY MOUTH TWICE DAILY 60 tablet 0  . potassium chloride SA (K-DUR,KLOR-CON) 20 MEQ tablet Take 20 mEq by mouth daily.      No current facility-administered medications for this visit.    Allergies:   Review of patient's allergies indicates no known allergies.    Social History:  The patient  reports that she has never smoked. She does not have any smokeless tobacco history on file. She reports that she drinks alcohol. She reports that she does not use illicit drugs.   Family History:  The patient's family history includes Heart failure in her  mother.    ROS:  Please see the history of present illness.   Otherwise, review of systems are positive for none.   All other systems are reviewed and negative.    PHYSICAL EXAM: VS:  BP 110/80 mmHg  Pulse 80  Ht 5\' 2"  (1.575 m)  Wt 126 lb (57.153 kg)  BMI 23.04 kg/m2  SpO2 99% , BMI Body mass index is 23.04 kg/(m^2). GEN: Well nourished, well developed, in no acute distress HEENT: normal Neck: no JVD, carotid bruits, or masses Cardiac: RRR; no murmurs, rubs, or gallops,  2+ edema Respiratory:  clear to auscultation bilaterally, normal work of breathing GI: soft, nontender, nondistended, + BS MS: no deformity or atrophy Skin: warm and dry, no rash Neuro:  Strength and sensation are intact Psych: euthymic mood, full  affect   EKG:  EKG was ordered today and showed NSR with LAE, IRBBB, anterior infarct    Recent Labs: 02/11/2014: ALT 62*; BUN 9; Creatinine 0.83; Hemoglobin 11.7*; Platelets 225; Potassium 3.7; Sodium 137    Lipid Panel    Component Value Date/Time   CHOL  02/19/2009 0630    150        ATP III CLASSIFICATION:  <200     mg/dL   Desirable  200-239  mg/dL   Borderline High  >=240    mg/dL   High          TRIG 107 02/19/2009 0630   HDL 55 02/19/2009 0630   CHOLHDL 2.7 02/19/2009 0630   VLDL 21 02/19/2009 0630   LDLCALC  02/19/2009 0630    74        Total Cholesterol/HDL:CHD Risk Coronary Heart Disease Risk Table                     Men   Women  1/2 Average Risk   3.4   3.3  Average Risk       5.0   4.4  2 X Average Risk   9.6   7.1  3 X Average Risk  23.4   11.0        Use the calculated Patient Ratio above and the CHD Risk Table to determine the patient's CHD Risk.        ATP III CLASSIFICATION (LDL):  <100     mg/dL   Optimal  100-129  mg/dL   Near or Above                    Optimal  130-159  mg/dL   Borderline  160-189  mg/dL   High  >190     mg/dL   Very High      Wt Readings from Last 3 Encounters:  06/24/14 126 lb (57.153 kg)      ASSESSMENT & PLAN:  1) Acute on Chronic systolic heart failure: Unknown etiology, never had ischemic work-up, EF 30-35% (01/2013) and now 20-25% by echo in April with biventricular failure.   - She has progressed to class III symptoms and unclear what has tipped her over.  She has not had an ischemic workup as of yet.  Dr. Haroldine Laws had discussed with patient in depth the options for further w/u including Stress cMRI to assess coronaries and quantify EF, CPX to determine how limited she is by her HF, or titrate of medications and repeat ECHO in the future.  At that time she wanted to try  titration of meds at that time 01/2013 since she did not  feel bad.  She now clearly has some decline in EF with worsening heart failure  symptoms.  Her Lasix helps initially with her edema but it reoccurs the next day.  Her lungs are clear today but she has signs of right sided HF with LE edema and increased abdominal girth despite following a strict low sodium diet.   - Will continue ACE I/ BB and dig at current doses.  Her BP is soft so I am hesitant to increase further.   -  I will get a Lexiscan cardiac MRI to assess coronary perfusion as well as get another assessment of EF.  Also there was some ? sarcoid in the past so MRI will help look for that.  If EF assessment c/w with echo findings then should be referred for AICD placement.  Her QRS is not widened so would not be CRT candidate.   2) HTN - controlled - continue ACE I and BB 3) Nausea and diarrhea possibly due to right heart failure.   Current medicines are reviewed at length with the patient today.  The patient does not have concerns regarding medicines.  The following changes have been made:  Increase lasix to 80mg  BID  Labs/ tests ordered today include: TSH, CMET, CBC, BNP, lexiscan cardiac MRI  No orders of the defined types were placed in this encounter.   I have spent a total of 60 minutes with the very complicated patient, of which, >50% of time was spent in direct patient care.  Disposition:   FU with Dr. Sung Amabile in 1 week  Signed, Sueanne Margarita, MD  06/24/2014 3:14 PM    [facility] [place], [place], [place]  [address] Phone: [phone]; Fax: [phone]

## 2014-06-24 NOTE — Patient Instructions (Signed)
Medication Instructions:  Your physician has recommended you make the following change in your medication:  1) INCREASE LASIX to 80 mg TWICE DAILY  Labwork: TODAY: CMET, TSH, BNP, CBC  Testing/Procedures: Dr. Radford Pax recommends you have a MR CARDIAC STRESS TEST.  Follow-Up: Your physician recommends that you schedule a follow-up appointment WITHIN THE NEXT WEEK with Dr. Jeffie Pollock.   Any Other Special Instructions Will Be Listed Below (If Applicable).

## 2014-06-25 LAB — CBC WITH DIFFERENTIAL/PLATELET
BASOS PCT: 0.6 % (ref 0.0–3.0)
Basophils Absolute: 0 10*3/uL (ref 0.0–0.1)
EOS PCT: 0.4 % (ref 0.0–5.0)
Eosinophils Absolute: 0 10*3/uL (ref 0.0–0.7)
HCT: 42.7 % (ref 36.0–46.0)
HEMOGLOBIN: 13.7 g/dL (ref 12.0–15.0)
LYMPHS ABS: 2 10*3/uL (ref 0.7–4.0)
Lymphocytes Relative: 48.7 % — ABNORMAL HIGH (ref 12.0–46.0)
MCHC: 32.1 g/dL (ref 30.0–36.0)
MCV: 76 fl — AB (ref 78.0–100.0)
MONOS PCT: 10.4 % (ref 3.0–12.0)
Monocytes Absolute: 0.4 10*3/uL (ref 0.1–1.0)
NEUTROS ABS: 1.6 10*3/uL (ref 1.4–7.7)
NEUTROS PCT: 39.9 % — AB (ref 43.0–77.0)
PLATELETS: 225 10*3/uL (ref 150.0–400.0)
RBC: 5.61 Mil/uL — ABNORMAL HIGH (ref 3.87–5.11)
RDW: 17.8 % — ABNORMAL HIGH (ref 11.5–15.5)
WBC: 4.1 10*3/uL (ref 4.0–10.5)

## 2014-06-25 LAB — COMPREHENSIVE METABOLIC PANEL
ALBUMIN: 3.6 g/dL (ref 3.5–5.2)
ALT: 21 U/L (ref 0–35)
AST: 28 U/L (ref 0–37)
Alkaline Phosphatase: 74 U/L (ref 39–117)
BUN: 12 mg/dL (ref 6–23)
CALCIUM: 9.4 mg/dL (ref 8.4–10.5)
CO2: 30 meq/L (ref 19–32)
Chloride: 99 mEq/L (ref 96–112)
Creatinine, Ser: 1.02 mg/dL (ref 0.40–1.20)
GFR: 72.2 mL/min (ref >60.00)
Glucose, Bld: 94 mg/dL (ref 70–99)
POTASSIUM: 4.1 meq/L (ref 3.5–5.1)
Sodium: 136 mEq/L (ref 135–145)
Total Bilirubin: 2.2 mg/dL — ABNORMAL HIGH (ref 0.2–1.2)
Total Protein: 7 g/dL (ref 6.0–8.3)

## 2014-06-25 LAB — BRAIN NATRIURETIC PEPTIDE: PRO B NATRI PEPTIDE: 2172 pg/mL — AB (ref 0.0–100.0)

## 2014-06-25 LAB — TSH: TSH: 2.9 u[IU]/mL (ref 0.35–4.50)

## 2014-06-26 ENCOUNTER — Ambulatory Visit: Payer: BC Managed Care – PPO | Admitting: Cardiology

## 2014-07-10 ENCOUNTER — Ambulatory Visit (HOSPITAL_COMMUNITY): Admission: RE | Admit: 2014-07-10 | Payer: BC Managed Care – PPO | Source: Ambulatory Visit

## 2014-07-12 ENCOUNTER — Inpatient Hospital Stay (HOSPITAL_COMMUNITY)
Admission: AD | Admit: 2014-07-12 | Discharge: 2014-07-19 | DRG: 286 | Disposition: A | Discharge status: Home / Self Care | Payer: BC Managed Care – PPO | Source: Ambulatory Visit | Attending: Internal Medicine | Admitting: Internal Medicine

## 2014-07-12 ENCOUNTER — Ambulatory Visit (HOSPITAL_BASED_OUTPATIENT_CLINIC_OR_DEPARTMENT_OTHER)
Admission: RE | Admit: 2014-07-12 | Discharge: 2014-07-12 | Disposition: A | Discharge status: Home / Self Care | Payer: BC Managed Care – PPO | Source: Ambulatory Visit | Attending: Internal Medicine | Admitting: Internal Medicine

## 2014-07-12 VITALS — BP 112/62 | HR 90 | Wt 129.5 lb

## 2014-07-12 DIAGNOSIS — M79606 Pain in leg, unspecified: Secondary | ICD-10-CM | POA: Diagnosis present

## 2014-07-12 DIAGNOSIS — I5023 Acute on chronic systolic (congestive) heart failure: Secondary | ICD-10-CM | POA: Diagnosis present

## 2014-07-12 DIAGNOSIS — R0600 Dyspnea, unspecified: Secondary | ICD-10-CM

## 2014-07-12 DIAGNOSIS — I5022 Chronic systolic (congestive) heart failure: Secondary | ICD-10-CM

## 2014-07-12 DIAGNOSIS — I509 Heart failure, unspecified: Secondary | ICD-10-CM | POA: Diagnosis not present

## 2014-07-12 DIAGNOSIS — I5043 Acute on chronic combined systolic (congestive) and diastolic (congestive) heart failure: Secondary | ICD-10-CM

## 2014-07-12 DIAGNOSIS — I10 Essential (primary) hypertension: Secondary | ICD-10-CM

## 2014-07-12 DIAGNOSIS — E876 Hypokalemia: Secondary | ICD-10-CM | POA: Diagnosis not present

## 2014-07-12 DIAGNOSIS — D509 Iron deficiency anemia, unspecified: Secondary | ICD-10-CM | POA: Diagnosis present

## 2014-07-12 DIAGNOSIS — Z01818 Encounter for other preprocedural examination: Secondary | ICD-10-CM

## 2014-07-12 DIAGNOSIS — I2589 Other forms of chronic ischemic heart disease: Secondary | ICD-10-CM | POA: Diagnosis not present

## 2014-07-12 DIAGNOSIS — Z515 Encounter for palliative care: Secondary | ICD-10-CM | POA: Diagnosis not present

## 2014-07-12 DIAGNOSIS — I428 Other cardiomyopathies: Secondary | ICD-10-CM | POA: Diagnosis present

## 2014-07-12 DIAGNOSIS — D649 Anemia, unspecified: Secondary | ICD-10-CM | POA: Diagnosis not present

## 2014-07-12 DIAGNOSIS — I429 Cardiomyopathy, unspecified: Secondary | ICD-10-CM | POA: Diagnosis not present

## 2014-07-12 DIAGNOSIS — Z79899 Other long term (current) drug therapy: Secondary | ICD-10-CM | POA: Diagnosis not present

## 2014-07-12 DIAGNOSIS — R570 Cardiogenic shock: Secondary | ICD-10-CM | POA: Diagnosis present

## 2014-07-12 LAB — COMPREHENSIVE METABOLIC PANEL
ALBUMIN: 3.1 g/dL — AB (ref 3.5–5.0)
ALK PHOS: 69 U/L (ref 38–126)
ALT: 24 U/L (ref 14–54)
AST: 36 U/L (ref 15–41)
Anion gap: 10 (ref 5–15)
BILIRUBIN TOTAL: 3 mg/dL — AB (ref 0.3–1.2)
BUN: 13 mg/dL (ref 6–20)
CHLORIDE: 101 mmol/L (ref 101–111)
CO2: 25 mmol/L (ref 22–32)
Calcium: 8.9 mg/dL (ref 8.9–10.3)
Creatinine, Ser: 1.15 mg/dL — ABNORMAL HIGH (ref 0.44–1.00)
GFR calc non Af Amer: 53 mL/min — ABNORMAL LOW (ref >60)
Glucose, Bld: 152 mg/dL — ABNORMAL HIGH (ref 65–99)
Potassium: 4.1 mmol/L (ref 3.5–5.1)
SODIUM: 136 mmol/L (ref 135–145)
Total Protein: 6.4 g/dL — ABNORMAL LOW (ref 6.5–8.1)

## 2014-07-12 LAB — CBC WITH DIFFERENTIAL/PLATELET
BASOS ABS: 0 10*3/uL (ref 0.0–0.1)
Basophils Relative: 1 % (ref 0–1)
EOS ABS: 0 10*3/uL (ref 0.0–0.7)
Eosinophils Relative: 1 % (ref 0–5)
HEMATOCRIT: 40.1 % (ref 36.0–46.0)
HEMOGLOBIN: 13.1 g/dL (ref 12.0–15.0)
LYMPHS ABS: 1.5 10*3/uL (ref 0.7–4.0)
LYMPHS PCT: 40 % (ref 12–46)
MCH: 23.9 pg — AB (ref 26.0–34.0)
MCHC: 32.7 g/dL (ref 30.0–36.0)
MCV: 73.3 fL — AB (ref 78.0–100.0)
Monocytes Absolute: 0.4 10*3/uL (ref 0.1–1.0)
Monocytes Relative: 11 % (ref 3–12)
NEUTROS PCT: 47 % (ref 43–77)
Neutro Abs: 1.7 10*3/uL (ref 1.7–7.7)
PLATELETS: 201 10*3/uL (ref 150–400)
RBC: 5.47 MIL/uL — AB (ref 3.87–5.11)
RDW: 17.7 % — ABNORMAL HIGH (ref 11.5–15.5)
WBC: 3.6 10*3/uL — ABNORMAL LOW (ref 4.0–10.5)

## 2014-07-12 LAB — BRAIN NATRIURETIC PEPTIDE: B Natriuretic Peptide: 1582.4 pg/mL — ABNORMAL HIGH (ref 0.0–100.0)

## 2014-07-12 LAB — DIGOXIN LEVEL: Digoxin Level: 0.5 ng/mL — ABNORMAL LOW (ref 0.8–2.0)

## 2014-07-12 LAB — TROPONIN I
Troponin I: <0.03 ng/mL (ref <0.031)
Troponin I: <0.03 ng/mL (ref <0.031)

## 2014-07-12 LAB — TSH: TSH: 1.882 u[IU]/mL (ref 0.350–4.500)

## 2014-07-12 LAB — MAGNESIUM: MAGNESIUM: 1.9 mg/dL (ref 1.7–2.4)

## 2014-07-12 MED ORDER — SODIUM CHLORIDE 0.9 % IV SOLN: 250.0000 mL | INTRAVENOUS | Status: DC | PRN

## 2014-07-12 MED ORDER — DIGOXIN 125 MCG PO TABS
0.1250 mg | ORAL_TABLET | Freq: Every day | ORAL | Status: DC
  Administered 2014-07-12 – 2014-07-19 (×8): 0.125 mg via ORAL
  Filled 2014-07-12 (×8): qty 1

## 2014-07-12 MED ORDER — ONDANSETRON HCL 4 MG/2ML IJ SOLN
4.0000 mg | Freq: Four times a day (QID) | INTRAMUSCULAR | Status: DC | PRN
  Filled 2014-07-12: qty 2

## 2014-07-12 MED ORDER — SODIUM CHLORIDE 0.9 % IJ SOLN
3.0000 mL | Freq: Two times a day (BID) | INTRAMUSCULAR | Status: DC
  Administered 2014-07-12 – 2014-07-19 (×5): 3 mL via INTRAVENOUS

## 2014-07-12 MED ORDER — SODIUM CHLORIDE 0.9 % IJ SOLN: 3.0000 mL | INTRAMUSCULAR | Status: DC | PRN

## 2014-07-12 MED ORDER — CARVEDILOL 3.125 MG PO TABS
3.1250 mg | ORAL_TABLET | Freq: Two times a day (BID) | ORAL | Status: DC
  Administered 2014-07-12 – 2014-07-13 (×2): 3.125 mg via ORAL
  Filled 2014-07-12 (×4): qty 1

## 2014-07-12 MED ORDER — FERROUS SULFATE 325 (65 FE) MG PO TABS
325.0000 mg | ORAL_TABLET | Freq: Every day | ORAL | Status: DC
  Administered 2014-07-12 – 2014-07-15 (×4): 325 mg via ORAL
  Filled 2014-07-12 (×5): qty 1

## 2014-07-12 MED ORDER — ENOXAPARIN SODIUM 30 MG/0.3ML ~~LOC~~ SOLN
30.0000 mg | SUBCUTANEOUS | Status: DC
  Administered 2014-07-12 – 2014-07-16 (×5): 30 mg via SUBCUTANEOUS
  Filled 2014-07-12 (×6): qty 0.3

## 2014-07-12 MED ORDER — ACETAMINOPHEN 325 MG PO TABS
650.0000 mg | ORAL_TABLET | ORAL | Status: DC | PRN
  Administered 2014-07-15: 650 mg via ORAL
  Filled 2014-07-12: qty 2

## 2014-07-12 MED ORDER — CALCIUM CARBONATE-VITAMIN D 500-200 MG-UNIT PO TABS
1.0000 | ORAL_TABLET | Freq: Every day | ORAL | Status: DC
Start: 2014-07-12 — End: 2014-07-19
  Administered 2014-07-12 – 2014-07-19 (×7): 1 via ORAL
  Filled 2014-07-12 (×8): qty 1

## 2014-07-12 MED ORDER — LISINOPRIL 10 MG PO TABS
10.0000 mg | ORAL_TABLET | Freq: Two times a day (BID) | ORAL | Status: DC
  Administered 2014-07-12 – 2014-07-13 (×3): 10 mg via ORAL
  Filled 2014-07-12 (×5): qty 1

## 2014-07-12 MED ORDER — SODIUM CHLORIDE 0.9 % IJ SOLN
10.0000 mL | INTRAMUSCULAR | Status: DC | PRN
  Administered 2014-07-12 – 2014-07-13 (×2): 20 mL
  Administered 2014-07-13 – 2014-07-16 (×7): 10 mL
  Administered 2014-07-17: 20 mL
  Administered 2014-07-18 – 2014-07-19 (×3): 10 mL
  Filled 2014-07-12 (×13): qty 40

## 2014-07-12 MED ORDER — POTASSIUM CHLORIDE CRYS ER 20 MEQ PO TBCR
20.0000 meq | EXTENDED_RELEASE_TABLET | Freq: Every day | ORAL | Status: DC
  Administered 2014-07-12 – 2014-07-13 (×2): 20 meq via ORAL
  Filled 2014-07-12 (×2): qty 1

## 2014-07-12 MED ORDER — FUROSEMIDE 10 MG/ML IJ SOLN
80.0000 mg | Freq: Two times a day (BID) | INTRAMUSCULAR | Status: DC
  Administered 2014-07-12 – 2014-07-16 (×8): 80 mg via INTRAVENOUS
  Filled 2014-07-12 (×11): qty 8

## 2014-07-12 NOTE — Progress Notes (Signed)
At 1530 introduced self to pt as in coming nurse till 7p.  Call bell at reach.  Instructed to call for assistance as needed.  Verbalized understanding.  Will continue to monitor.  Karie Kirks, Therapist, sports.

## 2014-07-12 NOTE — H&P (Signed)
Referring Physician: Primary Care: Primary Cardiologist:  HPI: Ms. Diana Bird is a 54 yo female who was referred to [place] by Dr. Delfina Redwood for systolic HF. Was admitted to the hospital in 2011 for pneumonia and was told afterwards that her heart was weak. No history of CAD. She does have a history of HTN. Denies every having a heart catheterization.   She returns for HF follow up. Increased dyspnea with exertion. + Orthopnea + PND. Weight at home 120-126 pounds. Unable to walk much. Less than 1/2 a block. Recently saw Dr Radford Pax and lasix was increased to 80 mg twice a day. Progressive lower extremity edema over the last 7 days. Denies CP.   SH: Lives at home alone. Does not smoke or alcohol  FH: Mother deceased 67 HF; Father living 59 yo and has HTN  2 brothers- 1 deceased; 1 living (HTN)  2 sisters - all living and both have HTN    Review of Systems: [y] = yes, [ ]  = no   General: Weight gain [ ] ; Weight loss [ ] ; Anorexia [ ] ; Fatigue [Y ]; Fever [ ] ; Chills [ ] ; Weakness [ Y]  Cardiac: Chest pain/pressure [ ] ; Resting SOB [ ] ; Exertional SOB [Y ]; Orthopnea [Y ]; Pedal Edema [Y ]; Palpitations [ ] ; Syncope [ ] ; Presyncope [ ] ; Paroxysmal nocturnal dyspnea[ ]   Pulmonary: Cough [ ] ; Wheezing[ ] ; Hemoptysis[ ] ; Sputum [ ] ; Snoring [ ]   GI: Vomiting[ ] ; Dysphagia[ ] ; Melena[ ] ; Hematochezia [ ] ; Heartburn[ ] ; Abdominal pain [Y ]; Constipation [ ] ; Diarrhea [ ] ; BRBPR [ ]   GU: Hematuria[ ] ; Dysuria [ ] ; Nocturia[ ]   Vascular: Pain in legs with walking [ Y]; Pain in feet with lying flat [ ] ; Non-healing sores [ ] ; Stroke [ ] ; TIA [ ] ; Slurred speech [ ] ;  Neuro: Headaches[ ] ; Vertigo[ ] ; Seizures[ ] ; Paresthesias[ ] ;Blurred vision [ ] ; Diplopia [ ] ; Vision changes [ ]   Ortho/Skin: Arthritis [ ] ; Joint pain [ ] ; Muscle pain [ ] ; Joint swelling [ ] ; Back Pain [ ] ; Rash [ ]   Psych: Depression[ ] ; Anxiety[ ]   Heme: Bleeding problems [ ] ; Clotting disorders [ ] ; Anemia [ ]   Endocrine: Diabetes [  ]; Thyroid dysfunction[ ]    Past Medical History  Diagnosis Date  . Chronic systolic heart failure   . Hypertension     No current facility-administered medications for this encounter.   Current Outpatient Prescriptions  Medication Sig Dispense Refill  . Calcium Carb-Cholecalciferol (CALCIUM-VITAMIN D) 600-400 MG-UNIT TABS Take 1 tablet by mouth daily.    . carvedilol (COREG) 6.25 MG tablet Take 1.5 tablets (9.375 mg total) by mouth 2 (two) times daily with a meal. 90 tablet 6  . digoxin (LANOXIN) 0.125 MG tablet Take 0.125 mg by mouth daily.    . ferrous sulfate 325 (65 FE) MG tablet Take 325 mg by mouth daily with breakfast.     . furosemide (LASIX) 40 MG tablet Take 2 tablets (80 mg total) by mouth 2 (two) times daily. 120 tablet 1  . lisinopril (PRINIVIL,ZESTRIL) 10 MG tablet TAKE 1 TABLET BY MOUTH TWICE DAILY 60 tablet 0  . potassium chloride SA (K-DUR,KLOR-CON) 20 MEQ tablet Take 20 mEq by mouth daily.       No Known Allergies    History   Social History  . Marital Status: Married    Spouse Name: N/A  . Number of Children: N/A  . Years of Education: N/A   Occupational History  .  Not on file.   Social History Main Topics  . Smoking status: Never Smoker   . Smokeless tobacco: Not on file  . Alcohol Use: Yes     Comment: 1-2 glasses of wine every other night  . Drug Use: No  . Sexual Activity: Not on file   Other Topics Concern  . Not on file   Social History Narrative   Works full time doing clerical work.       Family History  Problem Relation Age of Onset  . Heart failure Mother     There were no vitals filed for this visit.  PHYSICAL EXAM: General:  Chronically ill  appearing. No respiratory difficulty HEENT: normal Neck: supple. JVD.to jaw.  Carotids 2+ bilat; no bruits. No lymphadenopathy or thryomegaly appreciated. Cor: PMI nondisplaced. Regular rate & rhythm. No rubs, murmurs. + S3  Lungs: clear Abdomen: soft, nontender, ++distended. No  hepatosplenomegaly. No bruits or masses. Good bowel sounds. Extremities: no cyanosis, clubbing, rash, R and LLE 3+ edema Neuro: alert & oriented x 3, cranial nerves grossly intact. moves all 4 extremities w/o difficulty. Affect pleasant.   ASSESSMENT & PLAN:   1) acute/Chronic systolic heart failure: Unknown etiology, never had ischemic work-up, EF 05/06/2014 EF 20-25% NYHA III-IV . Marked volume overload. 3+ lower extremity edema extending to thighs despite increasing lasix to 80 mg twice a day. Place PICC and check CO-OX  - Start lasix 80 mg IV twice a day.  - Cut back carvedilol to 3.125 mg twice a day.  - Continue lisinopril to 10 mg BID.  - Will need RHC/LHC after fully diuresed. Never had ischemic episode.  2) HTN  - stable  Admit to telemetry for marked volume overload.  CLEGG,AMY NP-C  10:58 AM  Patient seen and examined with Darrick Grinder, NP. We discussed all aspects of the encounter. I agree with the assessment and plan as stated above.   She is markedly volume overloaded. Will admit for diuresis and R/L heart cath next week. Place PICC and check co-ox and CVPs. Cardiac MRI 3/10 without infiltrative disease.  Bensimhon, Daniel,MD 1:41 PM

## 2014-07-12 NOTE — Progress Notes (Signed)
Clarified with Dr. Haroldine Laws that patient is to have PICC line placed, IV team aware and at bedside for consent.  Patient requested to eat lunch prior to PICC placement so IV team will return to place PICC.  Will continue to monitor.

## 2014-07-12 NOTE — Progress Notes (Signed)
UR completed 

## 2014-07-12 NOTE — Progress Notes (Signed)
Patient arrived on unit, direct admit from HF clinic.  Patient alert and oriented x4, oriented to unit, placed on telemetry, CCMD aware.  Will continue to monitor.

## 2014-07-12 NOTE — Progress Notes (Signed)
Peripherally Inserted Central Catheter/Midline Placement  The IV Nurse has discussed with the patient and/or persons authorized to consent for the patient, the purpose of this procedure and the potential benefits and risks involved with this procedure.  The benefits include less needle sticks, lab draws from the catheter and patient may be discharged home with the catheter.  Risks include, but not limited to, infection, bleeding, blood clot (thrombus formation), and puncture of an artery; nerve damage and irregular heat beat.  Alternatives to this procedure were also discussed.  Consent obtained by Jacobo Forest, RN but I reviewed everything with patient prior to start.  PICC/Midline Placement Documentation        Diana Bird, Diana Bird 07/12/2014, 7:03 PM

## 2014-07-12 NOTE — Progress Notes (Signed)
Advanced Heart Failure Medication Review by a Pharmacist  Does the patient  feel that his/her medications are working for him/her?  no  Has the patient been experiencing any side effects to the medications prescribed?  no  Does the patient measure his/her own blood pressure or blood glucose at home?  no   Does the patient have any problems obtaining medications due to transportation or finances?   no  Understanding of regimen: good Understanding of indications: good Potential of compliance: good    Pharmacist comments: Patient presents to heart failure clinic and medications were reviewed with a pharmacist. No medication discrepancies noted at this time, however patient's edema has been getting progressively worse. She's frustrated that the swelling in her legs has been getting worse despite 3 dose increases of her Lasix. Currently taking 80mg  BID, however not on spironolactone or metolazone.   Megan E. Supple, Pharm.D Clinical Pharmacy Resident Pager: [phone] 07/12/2014 11:00 AM

## 2014-07-12 NOTE — Progress Notes (Signed)
Patient ID: Diana Bird, female   DOB: 10-06-1960, 54 y.o.   MRN: [id number] Referring Physician: Dr. Delfina Redwood  Primary Care: Dr. Delfina Redwood  Primary Cardiologist: N/A  Weight Range: Stable 125-129 lbs   HPI:  Diana Bird is a 54 yo female who was referred to [place] by Dr. Delfina Redwood for systolic HF. Was admitted to the hospital in 2011 for pneumonia and was told afterwards that her heart was weak. No history of CAD. She does have a history of HTN. Denies every having a heart catheterization.   She returns for HF follow up. Increased dyspnea with exertion. + Orthopnea + PND. Weight at home 120-126 pounds. Unable to walk much. Less than 1/2 a block.  Recently saw Dr Radford Pax and lasix was increased to 80 mg twice a day. Progressive lower extremity edema over the last 7 days. Denies CP.   SH: Lives at home alone. Does not smoke or alcohol   FH: Mother deceased 101 HF; Father living 5 yo and has HTN  2 brothers- 1 deceased; 1 living (HTN)  2 sisters - all living and both have HTN   Labs:  11/19/12: TSH 1.57, cholesterol 187, HDL 55, LDL 98, Creatinine 0.86, k+ 3.9   ROS: All systems negative except as listed in HPI, PMH and Problem List.  Past Medical History  Diagnosis Date  . Chronic systolic heart failure   . Hypertension     Current Outpatient Prescriptions  Medication Sig Dispense Refill  . Calcium Carb-Cholecalciferol (CALCIUM-VITAMIN D) 600-400 MG-UNIT TABS Take 1 tablet by mouth daily.    . carvedilol (COREG) 6.25 MG tablet Take 1.5 tablets (9.375 mg total) by mouth 2 (two) times daily with a meal. 90 tablet 6  . digoxin (LANOXIN) 0.125 MG tablet Take 0.125 mg by mouth daily.    . ferrous sulfate 325 (65 FE) MG tablet Take 325 mg by mouth daily with breakfast.     . furosemide (LASIX) 40 MG tablet Take 2 tablets (80 mg total) by mouth 2 (two) times daily. 120 tablet 1  . lisinopril (PRINIVIL,ZESTRIL) 10 MG tablet TAKE 1 TABLET BY MOUTH TWICE DAILY 60 tablet 0  . potassium  chloride SA (K-DUR,KLOR-CON) 20 MEQ tablet Take 20 mEq by mouth daily.      No current facility-administered medications for this encounter.    Filed Vitals:   07/12/14 1045  BP: 112/62  Pulse: 90  Weight: 129 lb 8 oz (58.741 kg)  SpO2: 99%   PHYSICAL EXAM: General: Mild dyspnea at rest.   HEENT: normal  Neck: supple. JVD to jaw.  Carotids 2+ bilat; no bruits. No lymphadenopathy or thryomegaly appreciated.  Cor: PMI nondisplaced. Regular rate & rhythm. No rubs, gallops or murmurs.  Lungs: Decreased in the base  Abdomen: soft, nontender, ++ distended. No hepatosplenomegaly. No bruits or masses. Good bowel sounds.  Extremities: no cyanosis, clubbing, rash, R and LLE 3+ edema extending to thighs.  Neuro: alert & oriented x 3, cranial nerves grossly intact. moves all 4 extremities w/o difficulty. Affect pleasant.   ASSESSMENT & PLAN:  1) Chronic systolic heart failure: Unknown etiology, never had ischemic work-up, EF 05/06/2014 EF 20-25% NYHA III-IV . Marked volume overload. 3+ lower extremity edema extending to thighs despite increasing lasix to 80 mg twice a day.  - Start lasix 80 mg IV twice a day.  - Cut back carvedilol to 3.125 mg twice a day.  - Continue  lisinopril to 10 mg BID.  - Will need RHC/LHC after fully diuresed.  Never had ischemic episode.  2) HTN  - stable  Admit to telemetry for marked volume overload.  Shereda Graw NP-C  10:58 AM

## 2014-07-13 ENCOUNTER — Inpatient Hospital Stay (HOSPITAL_COMMUNITY): Payer: BC Managed Care – PPO

## 2014-07-13 DIAGNOSIS — R0600 Dyspnea, unspecified: Secondary | ICD-10-CM

## 2014-07-13 DIAGNOSIS — R570 Cardiogenic shock: Secondary | ICD-10-CM

## 2014-07-13 LAB — BASIC METABOLIC PANEL WITH GFR
Anion gap: 9 (ref 5–15)
BUN: 13 mg/dL (ref 6–20)
CO2: 27 mmol/L (ref 22–32)
Calcium: 9 mg/dL (ref 8.9–10.3)
Chloride: 101 mmol/L (ref 101–111)
Creatinine, Ser: 1.04 mg/dL — ABNORMAL HIGH (ref 0.44–1.00)
GFR calc Af Amer: >60 mL/min
GFR calc non Af Amer: 59 mL/min — ABNORMAL LOW
Glucose, Bld: 121 mg/dL — ABNORMAL HIGH (ref 65–99)
Potassium: 3.8 mmol/L (ref 3.5–5.1)
Sodium: 137 mmol/L (ref 135–145)

## 2014-07-13 LAB — CARBOXYHEMOGLOBIN
Carboxyhemoglobin: 1.1 % (ref 0.5–1.5)
Methemoglobin: 1 % (ref 0.0–1.5)
O2 Saturation: 43.8 %
TOTAL HEMOGLOBIN: 12.9 g/dL (ref 12.0–16.0)

## 2014-07-13 LAB — CLOSTRIDIUM DIFFICILE BY PCR: Toxigenic C. Difficile by PCR: NEGATIVE

## 2014-07-13 MED ORDER — METOLAZONE 2.5 MG PO TABS
2.5000 mg | ORAL_TABLET | Freq: Two times a day (BID) | ORAL | Status: DC
  Administered 2014-07-13 – 2014-07-15 (×6): 2.5 mg via ORAL
  Filled 2014-07-13 (×9): qty 1

## 2014-07-13 MED ORDER — MILRINONE IN DEXTROSE 20 MG/100ML IV SOLN
0.1250 ug/kg/min | INTRAVENOUS | Status: DC
  Administered 2014-07-13 – 2014-07-14 (×2): 0.25 ug/kg/min via INTRAVENOUS
  Filled 2014-07-13 (×3): qty 100

## 2014-07-13 MED ORDER — POTASSIUM CHLORIDE CRYS ER 20 MEQ PO TBCR
20.0000 meq | EXTENDED_RELEASE_TABLET | Freq: Two times a day (BID) | ORAL | Status: DC
  Administered 2014-07-13 – 2014-07-19 (×11): 20 meq via ORAL
  Filled 2014-07-13 (×14): qty 1

## 2014-07-13 NOTE — Progress Notes (Signed)
Echocardiogram 2D Echocardiogram has been performed.  Jennette Dubin 07/13/2014, 10:32 AM

## 2014-07-13 NOTE — Progress Notes (Signed)
Pt complaining of nausea, offered Zofran but pt refused to take it. Gave ice chips for now. Will continue to monitor pt

## 2014-07-13 NOTE — Progress Notes (Signed)
Advanced Heart Failure Rounding Note   Subjective:    54 y/o with h/o HTN, chronic systolic HF due to presumed NICM.  Admitted from [facility] on 6/10 with decompensated HF. (Had not been seen in Clinic ~ 2 years)  Sluggish diuresis. Co-ox 44%. Denies dyspnea but feels very weak.    Objective:   Weight Range:  Vital Signs:   Temp:  [97.2 F (36.2 C)-98 F (36.7 C)] 98 F (36.7 C) (06/11 0631) Pulse Rate:  [84-96] 84 (06/11 0832) Resp:  [18] 18 (06/11 0631) BP: (103-124)/(67-90) 103/76 mmHg (06/11 1050) SpO2:  [100 %] 100 % (06/11 0631) Weight:  [57.886 kg (127 lb 9.9 oz)] 57.886 kg (127 lb 9.9 oz) (06/11 0631) Last BM Date: 07/13/14  Weight change: Filed Weights   07/12/14 1351 07/13/14 0631  Weight: 58.1 kg (128 lb 1.4 oz) 57.886 kg (127 lb 9.9 oz)    Intake/Output:   Intake/Output Summary (Last 24 hours) at 07/13/14 1419 Last data filed at 07/13/14 1100  Gross per 24 hour  Intake    840 ml  Output    725 ml  Net    115 ml     PHYSICAL EXAM: General:  Fatigued appearing. No respiratory difficulty HEENT: normal Neck: supple. JVD.to jaw. Carotids 2+ bilat; no bruits. No lymphadenopathy or thryomegaly appreciated. Cor: PMI nondisplaced. Regular rate & rhythm. No rubs, murmurs. + S3  Lungs: clear Abdomen: soft, nontender, ++distended. No hepatosplenomegaly. No bruits or masses. Good bowel sounds. Extremities: no cyanosis, clubbing, rash, R and LLE 3+ edema Neuro: alert & oriented x 3, cranial nerves grossly intact. moves all 4 extremities w/o difficulty. Affect pleasant.  Telemetry: NSR  Labs: Basic Metabolic Panel:  Recent Labs Lab 07/12/14 1526 07/13/14 0425  NA 136 137  K 4.1 3.8  CL 101 101  CO2 25 27  GLUCOSE 152* 121*  BUN 13 13  CREATININE 1.15* 1.04*  CALCIUM 8.9 9.0  MG 1.9  --     Liver Function Tests:  Recent Labs Lab 07/12/14 1526  AST 36  ALT 24  ALKPHOS 69  BILITOT 3.0*  PROT 6.4*  ALBUMIN 3.1*   No results for  input(s): LIPASE, AMYLASE in the last 168 hours. No results for input(s): AMMONIA in the last 168 hours.  CBC:  Recent Labs Lab 07/12/14 1526  WBC 3.6*  NEUTROABS 1.7  HGB 13.1  HCT 40.1  MCV 73.3*  PLT 201    Cardiac Enzymes:  Recent Labs Lab 07/12/14 1526 07/12/14 2230  TROPONINI <0.03 <0.03    BNP: BNP (last 3 results)  Recent Labs  07/12/14 1526  BNP 1582.4*    ProBNP (last 3 results)  Recent Labs  06/24/14 1642  PROBNP 2172.0*      Other results:  Imaging: Dg Chest 2 View  07/13/2014   CLINICAL DATA:  Dyspnea.  EXAM: CHEST  2 VIEW  COMPARISON:  02/11/2014  FINDINGS: Right PICC has been placed with tip overlying the lower SVC. Cardiac silhouette remains enlarged, unchanged. The patient has taken a greater inspiration than on the prior study. Left lower lobe opacity and left pleural effusion have resolved. There is no evidence of new airspace consolidation, edema, pleural effusion, or pneumothorax. No acute osseous abnormality is identified.  IMPRESSION: 1. Interval PICC placement as above. 2. Resolution of left lower lobe opacity and left pleural effusion. No evidence of acute cardiopulmonary process.   Electronically Signed   By: Logan Bores   On: 07/13/2014 11:44  Medications:     Scheduled Medications: . calcium-vitamin D  1 tablet Oral Daily  . carvedilol  3.125 mg Oral BID WC  . digoxin  0.125 mg Oral Daily  . enoxaparin (LOVENOX) injection  30 mg Subcutaneous Q24H  . ferrous sulfate  325 mg Oral Q breakfast  . furosemide  80 mg Intravenous BID  . lisinopril  10 mg Oral BID  . potassium chloride SA  20 mEq Oral Daily  . sodium chloride  3 mL Intravenous Q12H     Infusions:     PRN Medications:  sodium chloride, acetaminophen, ondansetron (ZOFRAN) IV, sodium chloride, sodium chloride   Assessment:   1. Acute on chronic systolic heart failure    --presumed NICM     --Echo 07/13/14. EF 20% RV moderately dilated.     --cMRI  3/10 EF 20%. Echo 22014 EF 30-35%    --refused cath in past 2. Cardiogenic shock    Plan/Discussion:     She is in shock. Will stop carvedilol.  Start milrinone. Add metolazone.   She is likely not candidate for advanced therapies as she lives alone (Has boyfriend. Kids live in [place]). When i raised the issue of home inotropes she got very anxious.   Will need cath at some point if she agrees though I suspect she has NICM. No infiltrative disease on MRI in 2010.  Length of Stay: 1   Bensimhon, Daniel MD 07/13/2014, 2:19 PM  Advanced Heart Failure Team Pager [phone] (M-F; [place])  Please contact [facility] for night-coverage after hours (4p -7a ) and weekends on [email or web address]

## 2014-07-14 ENCOUNTER — Encounter (HOSPITAL_COMMUNITY): Payer: Self-pay | Admitting: *Deleted

## 2014-07-14 LAB — BASIC METABOLIC PANEL
Anion gap: 9 (ref 5–15)
BUN: 10 mg/dL (ref 6–20)
CO2: 32 mmol/L (ref 22–32)
CREATININE: 1.06 mg/dL — AB (ref 0.44–1.00)
Calcium: 8.9 mg/dL (ref 8.9–10.3)
Chloride: 94 mmol/L — ABNORMAL LOW (ref 101–111)
GFR calc Af Amer: >60 mL/min (ref >60)
GFR calc non Af Amer: 58 mL/min — ABNORMAL LOW (ref >60)
Glucose, Bld: 93 mg/dL (ref 65–99)
POTASSIUM: 3.3 mmol/L — AB (ref 3.5–5.1)
SODIUM: 135 mmol/L (ref 135–145)

## 2014-07-14 LAB — CARBOXYHEMOGLOBIN
CARBOXYHEMOGLOBIN: 1.4 % (ref 0.5–1.5)
METHEMOGLOBIN: 1.1 % (ref 0.0–1.5)
O2 Saturation: 47.3 %
Total hemoglobin: 12.3 g/dL (ref 12.0–16.0)

## 2014-07-14 MED ORDER — POTASSIUM CHLORIDE CRYS ER 20 MEQ PO TBCR
40.0000 meq | EXTENDED_RELEASE_TABLET | Freq: Once | ORAL | Status: AC
  Administered 2014-07-14: 40 meq via ORAL
  Filled 2014-07-14: qty 2

## 2014-07-14 MED ORDER — SPIRONOLACTONE 12.5 MG HALF TABLET
12.5000 mg | ORAL_TABLET | Freq: Every day | ORAL | Status: DC
  Administered 2014-07-14: 12.5 mg via ORAL
  Filled 2014-07-14 (×2): qty 1

## 2014-07-14 MED ORDER — LOSARTAN POTASSIUM 50 MG PO TABS
100.0000 mg | ORAL_TABLET | Freq: Every day | ORAL | Status: DC
  Administered 2014-07-14 – 2014-07-15 (×2): 100 mg via ORAL
  Filled 2014-07-14 (×2): qty 2

## 2014-07-14 NOTE — Progress Notes (Signed)
Patient alert oriented x4, no c/o pain, or shortness of breath, vital sign stable. Will continue to monitor patient.

## 2014-07-14 NOTE — Progress Notes (Signed)
Advanced Heart Failure Rounding Note   Subjective:    54 y/o with h/o HTN, chronic systolic HF due to presumed NICM.  Admitted from [facility] on 6/10 with decompensated HF. (Had not been seen in Clinic ~ 2 years)  Milrinone started on 6/11 due to sluggish diuresis and co-ox 44%.   Feel a bit better. Diuresed 3 pounds. Co-ox still low at 47%   Objective:   Weight Range:  Vital Signs:   Temp:  [97 F (36.1 C)-97.9 F (36.6 C)] 97.9 F (36.6 C) (06/12 0525) Pulse Rate:  [84-103] 103 (06/12 0844) Resp:  [16] 16 (06/12 0525) BP: (103-132)/(76-97) 132/97 mmHg (06/12 0844) SpO2:  [99 %-100 %] 99 % (06/12 0844) Weight:  [56.337 kg (124 lb 3.2 oz)] 56.337 kg (124 lb 3.2 oz) (06/12 0525) Last BM Date: 07/13/14  Weight change: Filed Weights   07/12/14 1351 07/13/14 0631 07/14/14 0525  Weight: 58.1 kg (128 lb 1.4 oz) 57.886 kg (127 lb 9.9 oz) 56.337 kg (124 lb 3.2 oz)    Intake/Output:   Intake/Output Summary (Last 24 hours) at 07/14/14 0954 Last data filed at 07/14/14 0900  Gross per 24 hour  Intake   1101 ml  Output   1900 ml  Net   -799 ml     PHYSICAL EXAM: General: sitting up in chair. No respiratory difficulty HEENT: normal Neck: supple. JVD 9. Carotids 2+ bilat; no bruits. No lymphadenopathy or thryomegaly appreciated. Cor: PMI nondisplaced. Regular rate & rhythm. No rubs, murmurs. + S3  Lungs: clear Abdomen: soft, nontender, ++distended. No hepatosplenomegaly. No bruits or masses. Good bowel sounds. Extremities: no cyanosis, clubbing, rash, R and LLE 1-2+ edema Neuro: alert & oriented x 3, cranial nerves grossly intact. moves all 4 extremities w/o difficulty. Affect pleasant.  Telemetry: NSR  Labs: Basic Metabolic Panel:  Recent Labs Lab 07/12/14 1526 07/13/14 0425 07/14/14 0445  NA 136 137 135  K 4.1 3.8 3.3*  CL 101 101 94*  CO2 25 27 32  GLUCOSE 152* 121* 93  BUN 13 13 10   CREATININE 1.15* 1.04* 1.06*  CALCIUM 8.9 9.0 8.9  MG 1.9  --   --      Liver Function Tests:  Recent Labs Lab 07/12/14 1526  AST 36  ALT 24  ALKPHOS 69  BILITOT 3.0*  PROT 6.4*  ALBUMIN 3.1*   No results for input(s): LIPASE, AMYLASE in the last 168 hours. No results for input(s): AMMONIA in the last 168 hours.  CBC:  Recent Labs Lab 07/12/14 1526  WBC 3.6*  NEUTROABS 1.7  HGB 13.1  HCT 40.1  MCV 73.3*  PLT 201    Cardiac Enzymes:  Recent Labs Lab 07/12/14 1526 07/12/14 2230  TROPONINI <0.03 <0.03    BNP: BNP (last 3 results)  Recent Labs  07/12/14 1526  BNP 1582.4*    ProBNP (last 3 results)  Recent Labs  06/24/14 1642  PROBNP 2172.0*      Other results:  Imaging: Dg Chest 2 View  07/13/2014   CLINICAL DATA:  Dyspnea.  EXAM: CHEST  2 VIEW  COMPARISON:  02/11/2014  FINDINGS: Right PICC has been placed with tip overlying the lower SVC. Cardiac silhouette remains enlarged, unchanged. The patient has taken a greater inspiration than on the prior study. Left lower lobe opacity and left pleural effusion have resolved. There is no evidence of new airspace consolidation, edema, pleural effusion, or pneumothorax. No acute osseous abnormality is identified.  IMPRESSION: 1. Interval PICC placement as above.  2. Resolution of left lower lobe opacity and left pleural effusion. No evidence of acute cardiopulmonary process.   Electronically Signed   By: Logan Bores   On: 07/13/2014 11:44     Medications:     Scheduled Medications: . calcium-vitamin D  1 tablet Oral Daily  . digoxin  0.125 mg Oral Daily  . enoxaparin (LOVENOX) injection  30 mg Subcutaneous Q24H  . ferrous sulfate  325 mg Oral Q breakfast  . furosemide  80 mg Intravenous BID  . lisinopril  10 mg Oral BID  . metolazone  2.5 mg Oral BID  . potassium chloride SA  20 mEq Oral BID  . sodium chloride  3 mL Intravenous Q12H    Infusions: . milrinone 0.25 mcg/kg/min (07/14/14 0532)    PRN Medications: sodium chloride, acetaminophen, ondansetron (ZOFRAN)  IV, sodium chloride, sodium chloride   Assessment:   1. Acute on chronic systolic heart failure    --presumed NICM     --Echo 07/13/14. EF 20% RV moderately dilated.     --cMRI 3/10 EF 20%. Echo 22014 EF 30-35%    --refused cath in past 2. Cardiogenic shock 3. Microcytic anemia 4. Hypokalemia  Plan/Discussion:    Co-ox slightly improved on milrinone. Diuresis picking up. BP ok. I am reluctant to titrate milrinone up as she prefers to avoid home inotropes. Will continue diuresis and push vasodilators. Will switch lisinopril over to losartan with hope to get her on Entresto prior to d/c. Start spiro and supp K+. B-blocker stopped due to low output.   She is likely not candidate for advanced therapies as she lives alone (Has boyfriend. Kids live in [place]). When i raised the issue of home inotropes she got very anxious.   Will need cath at some point if she agrees though I suspect she has NICM. No infiltrative disease on MRI in 2010.  Check iron stores, SPEP/UPEP  Length of Stay: 2   Lamonta Cypress MD 07/14/2014, 9:54 AM  Advanced Heart Failure Team Pager [phone] (M-F; [place])  Please contact [facility] for night-coverage after hours (4p -7a ) and weekends on [email or web address]

## 2014-07-15 LAB — BASIC METABOLIC PANEL
ANION GAP: 11 (ref 5–15)
BUN: 10 mg/dL (ref 6–20)
CALCIUM: 9.3 mg/dL (ref 8.9–10.3)
CO2: 34 mmol/L — AB (ref 22–32)
Chloride: 89 mmol/L — ABNORMAL LOW (ref 101–111)
Creatinine, Ser: 1.12 mg/dL — ABNORMAL HIGH (ref 0.44–1.00)
GFR calc Af Amer: >60 mL/min (ref >60)
GFR calc non Af Amer: 54 mL/min — ABNORMAL LOW (ref >60)
Glucose, Bld: 105 mg/dL — ABNORMAL HIGH (ref 65–99)
Potassium: 3.5 mmol/L (ref 3.5–5.1)
SODIUM: 134 mmol/L — AB (ref 135–145)

## 2014-07-15 LAB — IRON AND TIBC
IRON: 67 ug/dL (ref 28–170)
Saturation Ratios: 15 % (ref 10.4–31.8)
TIBC: 442 ug/dL (ref 250–450)
UIBC: 375 ug/dL

## 2014-07-15 LAB — CARBOXYHEMOGLOBIN
Carboxyhemoglobin: 1.8 % — ABNORMAL HIGH (ref 0.5–1.5)
METHEMOGLOBIN: 1.1 % (ref 0.0–1.5)
O2 SAT: 69.5 %
Total hemoglobin: 13.6 g/dL (ref 12.0–16.0)

## 2014-07-15 LAB — FERRITIN: Ferritin: 28 ng/mL (ref 11–307)

## 2014-07-15 MED ORDER — TRAMADOL HCL 50 MG PO TABS
50.0000 mg | ORAL_TABLET | Freq: Four times a day (QID) | ORAL | Status: DC | PRN
  Administered 2014-07-15 – 2014-07-18 (×5): 50 mg via ORAL
  Filled 2014-07-15 (×5): qty 1

## 2014-07-15 MED ORDER — SACUBITRIL-VALSARTAN 24-26 MG PO TABS
1.0000 | ORAL_TABLET | Freq: Two times a day (BID) | ORAL | Status: DC
  Administered 2014-07-15 – 2014-07-16 (×2): 1 via ORAL
  Filled 2014-07-15 (×4): qty 1

## 2014-07-15 MED ORDER — FERROUS SULFATE 325 (65 FE) MG PO TABS
325.0000 mg | ORAL_TABLET | Freq: Every day | ORAL | Status: DC
  Administered 2014-07-16 – 2014-07-19 (×4): 325 mg via ORAL
  Filled 2014-07-15 (×5): qty 1

## 2014-07-15 MED ORDER — SPIRONOLACTONE 25 MG PO TABS
25.0000 mg | ORAL_TABLET | Freq: Every day | ORAL | Status: DC
  Administered 2014-07-15 – 2014-07-19 (×4): 25 mg via ORAL
  Filled 2014-07-15 (×5): qty 1

## 2014-07-15 NOTE — Progress Notes (Signed)
LVAD Coordinator Note:   Met with patient today do discuss and explain LVAD therapy. Patient was with a visitor and requested that we come back tomorrow anytime so she can spend time with her company today. Requested to leave information to review overnight so she will have a better idea of the therapy and what questions to ask when we come speak with her tomorrow sometime.   Left my contact information for her along with a packet to review information and will come back to see her tomorrow morning.   Janene Madeira, RN VAD Coordinator  Office: [phone] 24/7 VAD Pager: [phone]

## 2014-07-15 NOTE — Progress Notes (Signed)
Nutrition Brief Note  Patient identified on the Malnutrition Screening Tool (MST) Report  Wt Readings from Last 15 Encounters:  07/15/14 123 lb 8 oz (56.02 kg)  07/12/14 129 lb 8 oz (58.741 kg)  06/24/14 126 lb (57.153 kg)  01/03/13 128 lb (58.06 kg)  12/04/12 126 lb (57.153 kg)  06/04/08 114 lb (51.71 kg)    Body mass index is 22.58 kg/(m^2). Patient meets criteria for Normal Weight based on current BMI. Pt reports that she usually weighs 120 to 123 lbs. She denies any weight loss stating that recent weight loss is related to fluid. She reports having a small appetite and therefore she eats frequently throughout the day. She has been saving food from meal trays to eat as snacks. She would like to receive additional snacks.   Current diet order is 2 Gram Sodium, patient is consuming approximately 50-100% of meals at this time. Labs and medications reviewed. Pt reports following a low sodium diet PTA and denies any questions or education needs at this time.  No nutrition interventions warranted at this time. If nutrition issues arise, please consult RD.   Pryor Ochoa RD, LDN Inpatient Clinical Dietitian Pager: [phone] After Hours Pager: [phone]

## 2014-07-15 NOTE — Progress Notes (Signed)
Advanced Heart Failure Rounding Note   Subjective:    54 y/o with h/o HTN, chronic systolic HF due to presumed NICM.  Admitted from [facility] on 6/10 with decompensated HF. (Had not been seen in Clinic ~ 2 years)  Milrinone started on 6/11 due to sluggish diuresis and co-ox 44%. Yesterday losartan and spiro started.CO-OX improved today form 47% to 69%.    Brisk diuresis noted. But weight relatively stable.   Denies SOB. Headache and leg pain today.    Objective:   Weight Range:  Vital Signs:   Temp:  [97.6 F (36.4 C)-98.2 F (36.8 C)] 98 F (36.7 C) (06/13 1829) Pulse Rate:  [93-109] 101 (06/13 0613) Resp:  [16-18] 18 (06/13 0613) BP: (115-136)/(80-97) 132/81 mmHg (06/13 0613) SpO2:  [97 %-100 %] 97 % (06/13 9371) Weight:  [123 lb 8 oz (56.02 kg)] 123 lb 8 oz (56.02 kg) (06/13 0613) Last BM Date: 07/13/14  Weight change: Filed Weights   07/13/14 0631 07/14/14 0525 07/15/14 6967  Weight: 127 lb 9.9 oz (57.886 kg) 124 lb 3.2 oz (56.337 kg) 123 lb 8 oz (56.02 kg)    Intake/Output:   Intake/Output Summary (Last 24 hours) at 07/15/14 0841 Last data filed at 07/15/14 0600  Gross per 24 hour  Intake  821.6 ml  Output   3200 ml  Net -2378.4 ml     PHYSICAL EXAM: General: sitting up in chair. No respiratory difficulty HEENT: normal Neck: supple. JVD 8 . Carotids 2+ bilat; no bruits. No lymphadenopathy or thryomegaly appreciated. Cor: PMI laterally displaced. Regular rate & rhythm. No rubs, murmurs. + S3  Lungs: clear Abdomen: soft, nontender, ++distended. No hepatosplenomegaly. No bruits or masses. Good bowel sounds. Extremities: no cyanosis, clubbing, rash, R and LLE 1+ edema. Ted hose in place Neuro: alert & oriented x 3, cranial nerves grossly intact. moves all 4 extremities w/o difficulty. Affect pleasant.  Telemetry: NSR  Labs: Basic Metabolic Panel:  Recent Labs Lab 07/12/14 1526 07/13/14 0425 07/14/14 0445 07/15/14 0434  NA 136 137 135 134*  K  4.1 3.8 3.3* 3.5  CL 101 101 94* 89*  CO2 25 27 32 34*  GLUCOSE 152* 121* 93 105*  BUN 13 13 10 10   CREATININE 1.15* 1.04* 1.06* 1.12*  CALCIUM 8.9 9.0 8.9 9.3  MG 1.9  --   --   --     Liver Function Tests:  Recent Labs Lab 07/12/14 1526  AST 36  ALT 24  ALKPHOS 69  BILITOT 3.0*  PROT 6.4*  ALBUMIN 3.1*   No results for input(s): LIPASE, AMYLASE in the last 168 hours. No results for input(s): AMMONIA in the last 168 hours.  CBC:  Recent Labs Lab 07/12/14 1526  WBC 3.6*  NEUTROABS 1.7  HGB 13.1  HCT 40.1  MCV 73.3*  PLT 201    Cardiac Enzymes:  Recent Labs Lab 07/12/14 1526 07/12/14 2230  TROPONINI <0.03 <0.03    BNP: BNP (last 3 results)  Recent Labs  07/12/14 1526  BNP 1582.4*    ProBNP (last 3 results)  Recent Labs  06/24/14 1642  PROBNP 2172.0*      Other results:  Imaging: Dg Chest 2 View  07/13/2014   CLINICAL DATA:  Dyspnea.  EXAM: CHEST  2 VIEW  COMPARISON:  02/11/2014  FINDINGS: Right PICC has been placed with tip overlying the lower SVC. Cardiac silhouette remains enlarged, unchanged. The patient has taken a greater inspiration than on the prior study. Left lower lobe opacity  and left pleural effusion have resolved. There is no evidence of new airspace consolidation, edema, pleural effusion, or pneumothorax. No acute osseous abnormality is identified.  IMPRESSION: 1. Interval PICC placement as above. 2. Resolution of left lower lobe opacity and left pleural effusion. No evidence of acute cardiopulmonary process.   Electronically Signed   By: Logan Bores   On: 07/13/2014 11:44     Medications:     Scheduled Medications: . calcium-vitamin D  1 tablet Oral Daily  . digoxin  0.125 mg Oral Daily  . enoxaparin (LOVENOX) injection  30 mg Subcutaneous Q24H  . ferrous sulfate  325 mg Oral Q breakfast  . furosemide  80 mg Intravenous BID  . losartan  100 mg Oral Daily  . metolazone  2.5 mg Oral BID  . potassium chloride SA  20 mEq  Oral BID  . sodium chloride  3 mL Intravenous Q12H  . spironolactone  12.5 mg Oral Daily    Infusions: . milrinone 0.25 mcg/kg/min (07/14/14 1800)    PRN Medications: sodium chloride, acetaminophen, ondansetron (ZOFRAN) IV, sodium chloride, sodium chloride   Assessment:   1. Acute on chronic systolic heart failure    --presumed NICM     --Echo 07/13/14. EF 20% RV moderately dilated.     --cMRI 3/10 EF 20%. Echo 22014 EF 30-35%    --refused cath in past 2. Cardiogenic shock 3. Microcytic anemia 4. Hypokalemia 5. Leg pain   Plan/Discussion:    CO-OX improved. Continue milrinone and try to wean tomorrow if fully diuresed. Continue IV lasix+ metolazone. No bb with low output. Increase spiro to 25 mg daily. Continue losartan today and switch to entresto today. Renal function stable.    Will need cath at some point. No infiltrative disease on MRI in 2010.  Check iron stores ok.   SPEP/UPEP pending.   Consult cardiac rehab.   Length of Stay: 3 CLEGG,AMY NP-C  07/15/2014, 8:41 AM Advanced Heart Failure Team Pager [phone] (M-F; [place])  Please contact [facility] for night-coverage after hours (4p -7a ) and weekends on [email or web address]  Patient seen and examined with Darrick Grinder, NP. We discussed all aspects of the encounter. I agree with the assessment and plan as stated above.   Volume status and co-ox slightly improved but remains very tenuous with steady downhill course over past few years. We had a long talk about need for advanced therapies including home inotropes, LVAD and possible transplant. She was much more relaxed today and able to process this discussion and would like to move forward with an evaluation. She has identified 1-2 family members who could be her support team. Will wean milrinone to 0.125 today and see what co-ox does. Switch losartan to Entresto as BP tolerates. No b-blocker at this time. Will plan R and L heart cath tomorrow or Wednesday. VAD team and Dr.  Prescott Gum to see.   Total time spent 45 minutes. Over half that time spent discussing above.   Bensimhon, Daniel,MD 10:42 AM

## 2014-07-15 NOTE — Progress Notes (Signed)
CARDIAC REHAB PHASE I   PRE:  Rate/Rhythm: 100 ST    BP: sitting 111/76    SaO2: 97 RA  MODE:  Ambulation: 660 ft   POST:  Rate/Rhythm: 109 ST    BP: sitting 123/98, 118/85     SaO2: 96 RA  Pt willing to walk and tolerated well. Denied SOB or c/o. Steady. Seemed to enjoy walk. Return to bed as she sat up all morning. Encouraged her to walk as she wishes. Will f/u. 1100-1140   Josephina Shih [place] CES, ACSM 07/15/2014 11:39 AM

## 2014-07-16 LAB — RAPID URINE DRUG SCREEN, HOSP PERFORMED
Amphetamines: NOT DETECTED
BARBITURATES: NOT DETECTED
Benzodiazepines: NOT DETECTED
Cocaine: NOT DETECTED
Opiates: NOT DETECTED
TETRAHYDROCANNABINOL: NOT DETECTED

## 2014-07-16 LAB — URINE MICROSCOPIC-ADD ON

## 2014-07-16 LAB — URINALYSIS, ROUTINE W REFLEX MICROSCOPIC
BILIRUBIN URINE: NEGATIVE
Glucose, UA: NEGATIVE mg/dL
Ketones, ur: NEGATIVE mg/dL
Leukocytes, UA: NEGATIVE
Nitrite: NEGATIVE
Protein, ur: 30 mg/dL — AB
Specific Gravity, Urine: 1.009 (ref 1.005–1.030)
Urobilinogen, UA: 0.2 mg/dL (ref 0.0–1.0)
pH: 8 (ref 5.0–8.0)

## 2014-07-16 LAB — BASIC METABOLIC PANEL
ANION GAP: 12 (ref 5–15)
BUN: 8 mg/dL (ref 6–20)
CALCIUM: 10.1 mg/dL (ref 8.9–10.3)
CO2: 36 mmol/L — ABNORMAL HIGH (ref 22–32)
Chloride: 87 mmol/L — ABNORMAL LOW (ref 101–111)
Creatinine, Ser: 0.94 mg/dL (ref 0.44–1.00)
GFR calc Af Amer: >60 mL/min (ref >60)
Glucose, Bld: 87 mg/dL (ref 65–99)
Potassium: 3.6 mmol/L (ref 3.5–5.1)
SODIUM: 135 mmol/L (ref 135–145)

## 2014-07-16 LAB — IMMUNOFIXATION ELECTROPHORESIS
IGG (IMMUNOGLOBIN G), SERUM: 665 mg/dL — AB (ref 700–1600)
IgA: 151 mg/dL (ref 87–352)
IgM, Serum: 75 mg/dL (ref 26–217)
Total Protein ELP: 4.5 g/dL — ABNORMAL LOW (ref 6.0–8.5)

## 2014-07-16 LAB — PROTIME-INR
INR: 1.16 (ref 0.00–1.49)
Prothrombin Time: 15 seconds (ref 11.6–15.2)

## 2014-07-16 LAB — TYPE AND SCREEN
ABO/RH(D): O POS
ANTIBODY SCREEN: NEGATIVE

## 2014-07-16 LAB — ABO/RH: ABO/RH(D): O POS

## 2014-07-16 LAB — CARBOXYHEMOGLOBIN
Carboxyhemoglobin: 1.6 % — ABNORMAL HIGH (ref 0.5–1.5)
METHEMOGLOBIN: 1.2 % (ref 0.0–1.5)
O2 Saturation: 58.9 %
TOTAL HEMOGLOBIN: 14.9 g/dL (ref 12.0–16.0)

## 2014-07-16 LAB — ANTITHROMBIN III: AntiThromb III Func: 80 % (ref 75–120)

## 2014-07-16 LAB — APTT: APTT: 30 s (ref 24–37)

## 2014-07-16 MED ORDER — SODIUM CHLORIDE 0.9 % IV SOLN
INTRAVENOUS | Status: DC
  Administered 2014-07-17: 06:00:00 via INTRAVENOUS

## 2014-07-16 MED ORDER — FUROSEMIDE 40 MG PO TABS
40.0000 mg | ORAL_TABLET | Freq: Two times a day (BID) | ORAL | Status: DC
  Administered 2014-07-16 – 2014-07-19 (×6): 40 mg via ORAL
  Filled 2014-07-16 (×8): qty 1

## 2014-07-16 MED ORDER — SACUBITRIL-VALSARTAN 49-51 MG PO TABS
1.0000 | ORAL_TABLET | Freq: Two times a day (BID) | ORAL | Status: DC
  Administered 2014-07-16 – 2014-07-19 (×5): 1 via ORAL
  Filled 2014-07-16 (×7): qty 1

## 2014-07-16 MED ORDER — SODIUM CHLORIDE 0.9 % IV SOLN: 250.0000 mL | INTRAVENOUS | Status: DC | PRN

## 2014-07-16 MED ORDER — SODIUM CHLORIDE 0.9 % IJ SOLN: 3.0000 mL | INTRAMUSCULAR | Status: DC | PRN

## 2014-07-16 MED ORDER — ASPIRIN 81 MG PO CHEW
81.0000 mg | CHEWABLE_TABLET | ORAL | Status: AC
  Administered 2014-07-17: 81 mg via ORAL
  Filled 2014-07-16: qty 1

## 2014-07-16 MED ORDER — ENSURE ENLIVE PO LIQD
237.0000 mL | Freq: Two times a day (BID) | ORAL | Status: DC
  Administered 2014-07-18 (×2): 237 mL via ORAL

## 2014-07-16 MED ORDER — SODIUM CHLORIDE 0.9 % IJ SOLN
3.0000 mL | Freq: Two times a day (BID) | INTRAMUSCULAR | Status: DC
  Administered 2014-07-16: 3 mL via INTRAVENOUS

## 2014-07-16 NOTE — Progress Notes (Signed)
Initial Nutrition Assessment  INTERVENTION:  Ensure Enlive (each supplement provides 350kcal and 20 grams of protein), Snacks  NUTRITION DIAGNOSIS:  Unintentional weight loss related to flare-up of chronic illness as evidenced by 7% percent weight loss.   GOAL:  Patient will meet greater than or equal to 90% of their needs   MONITOR:  PO intake, Supplement acceptance, Labs, Weight trends, I & O's  REASON FOR ASSESSMENT:  Consult Assessment of nutrition requirement/status  ASSESSMENT: 54 y/o with h/o HTN, chronic systolic HF due to presumed NICM.  RD met with pt yesterday at which time pt reported having a small appetite therefore eating 6 small meals daily instead of 3. She denied weight loss, reporting usual weight of 120 lbs. Pt's weight yesterday was 123 lbs. RD consulted today for "evaluation for LVAD in setting of advanced heart failure. Potential open heart operation and would like to ensure optimized nutrition prior to.". Pt was re-weighed today and is now 8 lbs below her usual weight- 7% weight loss. Pt meeting with LVAD team member at time of visit today. Pt is agreeable to trying some nutritional supplements to improve nutrient intake prior to surgery.   Labs: low chloride, low sodium  Height:  Ht Readings from Last 1 Encounters:  06/24/14 $RemoveB'5\' 2"'mVentikm$  (1.575 m)    Weight:  Wt Readings from Last 1 Encounters:  07/16/14 112 lb 12.8 oz (51.166 kg)   07/15/14 123 lb 8 oz (56.02 kg)        Ideal Body Weight:  50 kg  Wt Readings from Last 10 Encounters:  07/16/14 112 lb 12.8 oz (51.166 kg)  07/12/14 129 lb 8 oz (58.741 kg)  06/24/14 126 lb (57.153 kg)  01/03/13 128 lb (58.06 kg)  12/04/12 126 lb (57.153 kg)  06/04/08 114 lb (51.71 kg)    BMI:  Body mass index is 20.63 kg/(m^2).  Estimated Nutritional Needs:  Kcal:  1600-1800  Protein:  60-70 grams  Fluid:  1.6-1.8 L/day  Skin:  Reviewed, no issues  Diet Order:  Diet 2 gram sodium Room service  appropriate?: Yes; Fluid consistency:: Thin Diet NPO time specified Except for: Sips with Meds  EDUCATION NEEDS:  No education needs identified at this time   Intake/Output Summary (Last 24 hours) at 07/16/14 1713 Last data filed at 07/16/14 1654  Gross per 24 hour  Intake 655.02 ml  Output   1976 ml  Net -1320.98 ml    Last BM:  6/14  Pryor Ochoa RD, LDN Inpatient Clinical Dietitian Pager: [phone] After Hours Pager: [phone]

## 2014-07-16 NOTE — Progress Notes (Signed)
[id number] Staff checked with pt to see if she would like to walk and would like to rest right now. Stated had been up already and resting. Will continue to follow.Graylon Good RN BSN 07/16/2014 2:43 PM

## 2014-07-16 NOTE — Progress Notes (Signed)
Advanced Heart Failure Rounding Note   Subjective:    54 y/o with h/o HTN, chronic systolic HF due to presumed NICM.  Admitted from [facility] on 6/10 with decompensated HF. (Had not been seen in Clinic ~ 2 years)  Milrinone started on 6/11 due to sluggish diuresis and co-ox 44%. Delene Loll and Spiro started 07/15/14 CO-OX  47% -> 69% -> 59%   Brisk diuresis noted. Shows 9 lb weight loss, done on standing scale, confirmed with nurse.  -4.5 liters overall.    She states she has not yet chosen a "teammate" for possible LVAD placement.  Says she will continue to think on it and make a decision and have someone for [place] to talk to tonight.   Objective:   Weight Range:  Vital Signs:   Temp:  [97.3 F (36.3 C)-98.3 F (36.8 C)] 98 F (36.7 C) (06/14 0547) Pulse Rate:  [97-103] 97 (06/14 0547) Resp:  [17-18] 17 (06/14 0547) BP: (114-123)/(75-93) 122/86 mmHg (06/14 0547) SpO2:  [97 %-99 %] 97 % (06/14 0547) Weight:  [112 lb 12.8 oz (51.166 kg)] 112 lb 12.8 oz (51.166 kg) (06/14 0547) Last BM Date: 07/15/14  Weight change: Filed Weights   07/14/14 0525 07/15/14 0613 07/16/14 0547  Weight: 124 lb 3.2 oz (56.337 kg) 123 lb 8 oz (56.02 kg) 112 lb 12.8 oz (51.166 kg)    Intake/Output:   Intake/Output Summary (Last 24 hours) at 07/16/14 0825 Last data filed at 07/16/14 0600  Gross per 24 hour  Intake 1042.33 ml  Output   2226 ml  Net -1183.67 ml     PHYSICAL EXAM: General: sitting up in bed. No respiratory difficulty HEENT: normal Neck: supple. JVD 6-7 . Carotids 2+ bilat; no bruits. No lymphadenopathy or thryomegaly appreciated. Cor: PMI laterally displaced. Regular rate & rhythm. No rubs, murmurs. + S3  Lungs: clear Abdomen: soft, nontender, +distended. No hepatosplenomegaly. No bruits or masses. Good bowel sounds. Extremities: no cyanosis, clubbing, rash, R and LLE 1+ edema. Ted hose in place Neuro: alert & oriented x 3, cranial nerves grossly intact. moves all 4 extremities  w/o difficulty. Affect flat but appropriate.  Telemetry: NSR  Labs: Basic Metabolic Panel:  Recent Labs Lab 07/12/14 1526 07/13/14 0425 07/14/14 0445 07/15/14 0434 07/16/14 0440  NA 136 137 135 134* 135  K 4.1 3.8 3.3* 3.5 3.6  CL 101 101 94* 89* 87*  CO2 25 27 32 34* 36*  GLUCOSE 152* 121* 93 105* 87  BUN 13 13 10 10 8   CREATININE 1.15* 1.04* 1.06* 1.12* 0.94  CALCIUM 8.9 9.0 8.9 9.3 10.1  MG 1.9  --   --   --   --     Liver Function Tests:  Recent Labs Lab 07/12/14 1526  AST 36  ALT 24  ALKPHOS 69  BILITOT 3.0*  PROT 6.4*  ALBUMIN 3.1*   No results for input(s): LIPASE, AMYLASE in the last 168 hours. No results for input(s): AMMONIA in the last 168 hours.  CBC:  Recent Labs Lab 07/12/14 1526  WBC 3.6*  NEUTROABS 1.7  HGB 13.1  HCT 40.1  MCV 73.3*  PLT 201    Cardiac Enzymes:  Recent Labs Lab 07/12/14 1526 07/12/14 2230  TROPONINI <0.03 <0.03    BNP: BNP (last 3 results)  Recent Labs  07/12/14 1526  BNP 1582.4*    ProBNP (last 3 results)  Recent Labs  06/24/14 1642  PROBNP 2172.0*      Other results:  Imaging: No results  found.   Medications:     Scheduled Medications: . calcium-vitamin D  1 tablet Oral Daily  . digoxin  0.125 mg Oral Daily  . enoxaparin (LOVENOX) injection  30 mg Subcutaneous Q24H  . ferrous sulfate  325 mg Oral Q breakfast  . furosemide  80 mg Intravenous BID  . metolazone  2.5 mg Oral BID  . potassium chloride SA  20 mEq Oral BID  . sacubitril-valsartan  1 tablet Oral BID  . sodium chloride  3 mL Intravenous Q12H  . spironolactone  25 mg Oral Daily    Infusions: . milrinone 0.125 mcg/kg/min (07/15/14 1032)    PRN Medications: sodium chloride, acetaminophen, ondansetron (ZOFRAN) IV, sodium chloride, sodium chloride, traMADol   Assessment:   1. Acute on chronic systolic heart failure    --presumed NICM     --Echo 07/13/14. EF 20% RV moderately dilated.     --cMRI 3/10 EF 20%. Echo  22014 EF 30-35%    --refused cath in past 2. Cardiogenic shock 3. Microcytic anemia 4. Hypokalemia 5. Leg pain   Plan/Discussion:    CO-OX slightly down on decreased milrinone, but OK at 59%. Continue IV lasix+ metolazone. No bb with low output. Cont spiro 25 mg and entresto. Renal function good.    Will need cath at some point.   Iron stores - OK.   SPEP/UPEP pending.   Consult cardiac rehab.   Long discussion about advanced therapies yesterday  including home inotropes, LVAD, and possible transplant.. She has identified 1-2 family members who could be her support team, but has not made a final decision. R/LHC today vs tomorrow.  VAD team and Dr. Prescott Gum seeing as well.  Length of Stay: 4 Shirley Friar PA-C 07/16/2014, 8:25 AM Advanced Heart Failure Team Pager [phone] (M-F; 7a - 4p)  Please contact [facility] for night-coverage after hours (4p -7a ) and weekends on [email or web address]  Patient seen and examined with Oda Kilts, PA-C. We discussed all aspects of the encounter. I agree with the assessment and plan as stated above.   Relatively stable. Feels much better.  Co-ox slightly down on reduced dose milrinone. Will stop milrinone now and plan R/L cath tomorrow. Case discussed extensively at [facility] and felt to be potential VAD candidate and possibly bridge to transplant. She seems more comfortable with the idea of advanced therapies today. Will have VAD coordinator and VAD patient speak with her today. Given size may be candidate for Heartware device. Will need cMRI to exclude infiltrative disease and also CT C/A/P as part of VAD w/u.   Bensimhon, Daniel,MD 3:08 PM

## 2014-07-16 NOTE — Progress Notes (Signed)
CSW met with patient at bedside to complete LVAD assessment. Completed full LVAD assessment to follow. Diana Bird, [name]

## 2014-07-16 NOTE — Progress Notes (Signed)
[facility] EDUCATION NOTE:   VAD evaluation consent reviewed and signed by Ms. Folts. She read over the consent completely last night and I answered all questions regarding the evaluation process. Assigned caregiver is not currently present.  Initial VAD teaching completed with pt and caregiver.   VAD educational packet including "HM II Patient Handbook", "HM II Left Ventricular Assist System" packet, and "[facility] HM II Patient Education" reviewed in detail with me and left at bedside for continued reference.  All questions answered regarding VAD implant, hospital stay, and what to expect when discharged home living with a heart pump.  Pt identified her boyfriend as her primary caregiver and sister as back-up if this therapy should be deemed appropriate for her.  Explained need for 24/7 care when pt is  discharged home due to sternal precautions, adaptation to living on support, emotional support, consistent and meticulous exit site care and management, medication adherence and high volume of follow up visits with the [facility] after discharge;  both pt and caregiver verbalized understanding of above.   Explained that LVAD can be implanted for two indications in the setting of advanced left ventricular heart failure treatment:  1. Bridge to transplant - used for patients who cannot safely wait for heart transplant without this device.  Or   2. Destination therapy - used for patients until end of life or recovery of heart function.  Patient acknowledge that the indication at this point in time for LVAD therapy would be Destination Therapy for her due to her reluctancy for transplant (she is not sure she would want a transplant at this time) and an incomplete full medical evaluation for transplant candidacy.   Provided brief equipment overview of Heartmate II pump--she is very interested if there are other pump options out there to consider. Discussed that we have a second pump  offered here at the hospital, however it is only approved currently for BTT patients, although her BSA and overall size may be condition enough to seek authorization from insurance provider for the smaller pump. Will continue to assess, however education provided only on the Heartmate II pump at this time d/t therapy indication.    Extended to her the option to have VAD patient come by to see her and discuss living life on support and answer any questions she may hae for her today. She is open to this and looks forward to talking with her.   Discussed that the consent is only for the evaluation process and not to undergo surgery--she understands this and signed the evaluation consent today.    Identified the following lifestyle modifications while living on MCS:   1. No driving for at least three months and then only if doctor gives permission to do so.  2. No tub baths while pump implanted, and shower only when doctor gives permission.  3. No swimming or submersion in water while implanted with pump.  4. No contact sports or engaging in jumping activities.  5. Always have a backup controller, charged spare batteries, and battery clips nearby at all times in case of emergency.  6. Call the doctor or hospital contact person if any change in how the pump sounds, feels, or works.  7. Plan to sleep only when connected to the power module.  8. Do not sleep on your stomach.  9. Keep a backup system controller, charged batteries, battery clips, and flashlight near you during sleep in case of electrical power outage.  10. Exit site care including  dressing changes, monitoring for infection, and importance of keeping percutaneous lead stabilized at all times.   Reviewed pictures of VAD drive line, site care, dressing changes, and drive line stabilization including securement attachment device and abdominal binder.   All questions have been answered at this time and contact information was  provided should they encounter any further questions.  She is agreeable at this time to the evaluation process and will move forward.    Planning to undergo RHC/LHC tomorrow with Dr. Haroldine Laws off Milrinone. Will repeat cardiac MRI w/wo contrast Wednesday after her procedure to compare to 2010 scan. Orders entered per evaluation protocol and will work on obtaining records externally. Will need to obtain caregiver consent, home inspection check list, and insurance prior-auth if she is deemed a candidate for therapy and wishes to proceed with surgery.   Will continue to follow with VAD/CHF team throughout the hospitalization and during outpatient follow up.   Total Session Time: 60 minutes  Janene Madeira, RN VAD Coordinator  Office: [phone] 24/7 VAD Pager: [phone]

## 2014-07-17 ENCOUNTER — Ambulatory Visit (HOSPITAL_COMMUNITY): Payer: BC Managed Care – PPO

## 2014-07-17 ENCOUNTER — Encounter (HOSPITAL_COMMUNITY): Payer: Self-pay | Admitting: Internal Medicine

## 2014-07-17 ENCOUNTER — Inpatient Hospital Stay (HOSPITAL_COMMUNITY): Payer: BC Managed Care – PPO

## 2014-07-17 ENCOUNTER — Encounter (HOSPITAL_COMMUNITY)
Admission: AD | Disposition: A | Discharge status: Home / Self Care | Payer: BC Managed Care – PPO | Source: Ambulatory Visit | Attending: Internal Medicine

## 2014-07-17 DIAGNOSIS — I509 Heart failure, unspecified: Secondary | ICD-10-CM

## 2014-07-17 DIAGNOSIS — I5023 Acute on chronic systolic (congestive) heart failure: Principal | ICD-10-CM

## 2014-07-17 DIAGNOSIS — Z515 Encounter for palliative care: Secondary | ICD-10-CM

## 2014-07-17 HISTORY — PX: CARDIAC CATHETERIZATION: SHX172

## 2014-07-17 LAB — HEPATITIS B CORE ANTIBODY, TOTAL: Hep B Core Total Ab: POSITIVE — AB

## 2014-07-17 LAB — POCT I-STAT 3, VENOUS BLOOD GAS (G3P V)
ACID-BASE EXCESS: 5 mmol/L — AB (ref 0.0–2.0)
Acid-Base Excess: 7 mmol/L — ABNORMAL HIGH (ref 0.0–2.0)
Bicarbonate: 32.1 mEq/L — ABNORMAL HIGH (ref 20.0–24.0)
Bicarbonate: 35.2 mEq/L — ABNORMAL HIGH (ref 20.0–24.0)
O2 SAT: 71 %
O2 Saturation: 70 %
PH VEN: 7.389 — AB (ref 7.250–7.300)
PO2 VEN: 38 mmHg (ref 30.0–45.0)
TCO2: 37 mmol/L (ref 0–100)
TCO2: 34 mmol/L (ref 0–100)
pCO2, Ven: 53.4 mmHg — ABNORMAL HIGH (ref 45.0–50.0)
pCO2, Ven: 58.3 mmHg — ABNORMAL HIGH (ref 45.0–50.0)
pH, Ven: 7.387 — ABNORMAL HIGH (ref 7.250–7.300)
pO2, Ven: 38 mmHg (ref 30.0–45.0)

## 2014-07-17 LAB — LIPID PANEL
CHOLESTEROL: 94 mg/dL (ref 0–200)
HDL: 27 mg/dL — ABNORMAL LOW (ref >40)
LDL CALC: 40 mg/dL (ref 0–99)
Total CHOL/HDL Ratio: 3.5 RATIO
Triglycerides: 137 mg/dL (ref <150)
VLDL: 27 mg/dL (ref 0–40)

## 2014-07-17 LAB — CBC
HCT: 45.6 % (ref 36.0–46.0)
HEMOGLOBIN: 14.9 g/dL (ref 12.0–15.0)
MCH: 23.7 pg — ABNORMAL LOW (ref 26.0–34.0)
MCHC: 32.7 g/dL (ref 30.0–36.0)
MCV: 72.6 fL — ABNORMAL LOW (ref 78.0–100.0)
Platelets: 223 10*3/uL (ref 150–400)
RBC: 6.28 MIL/uL — AB (ref 3.87–5.11)
RDW: 19.3 % — ABNORMAL HIGH (ref 11.5–15.5)
WBC: 4.6 10*3/uL (ref 4.0–10.5)

## 2014-07-17 LAB — BASIC METABOLIC PANEL
Anion gap: 12 (ref 5–15)
BUN: 7 mg/dL (ref 6–20)
CHLORIDE: 84 mmol/L — AB (ref 101–111)
CO2: 36 mmol/L — ABNORMAL HIGH (ref 22–32)
Calcium: 9.4 mg/dL (ref 8.9–10.3)
Creatinine, Ser: 1.03 mg/dL — ABNORMAL HIGH (ref 0.44–1.00)
GFR calc Af Amer: >60 mL/min (ref >60)
GFR calc non Af Amer: >60 mL/min (ref >60)
GLUCOSE: 84 mg/dL (ref 65–99)
POTASSIUM: 3.8 mmol/L (ref 3.5–5.1)
SODIUM: 132 mmol/L — AB (ref 135–145)

## 2014-07-17 LAB — HEPATITIS B SURFACE ANTIBODY,QUALITATIVE: HEP B S AB: REACTIVE

## 2014-07-17 LAB — CARBOXYHEMOGLOBIN
CARBOXYHEMOGLOBIN: 1.6 % — AB (ref 0.5–1.5)
Methemoglobin: 1.2 % (ref 0.0–1.5)
O2 SAT: 52.7 %
Total hemoglobin: 15.3 g/dL (ref 12.0–16.0)

## 2014-07-17 LAB — URIC ACID: Uric Acid, Serum: 10.5 mg/dL — ABNORMAL HIGH (ref 2.3–6.6)

## 2014-07-17 LAB — PROTIME-INR
INR: 1.11 (ref 0.00–1.49)
Prothrombin Time: 14.5 seconds (ref 11.6–15.2)

## 2014-07-17 LAB — HIV ANTIBODY (ROUTINE TESTING W REFLEX): HIV Screen 4th Generation wRfx: NONREACTIVE

## 2014-07-17 LAB — HEPATITIS B SURFACE ANTIGEN: HEP B S AG: NEGATIVE

## 2014-07-17 LAB — HEPATITIS C ANTIBODY: HCV Ab: 0.2 s/co ratio (ref 0.0–0.9)

## 2014-07-17 LAB — PREALBUMIN: PREALBUMIN: 17.5 mg/dL — AB (ref 18–38)

## 2014-07-17 LAB — LACTATE DEHYDROGENASE: LDH: 258 U/L — ABNORMAL HIGH (ref 98–192)

## 2014-07-17 SURGERY — RIGHT/LEFT HEART CATH AND CORONARY ANGIOGRAPHY
Anesthesia: LOCAL

## 2014-07-17 MED ORDER — SODIUM CHLORIDE 0.9 % IV SOLN: 250.0000 mL | INTRAVENOUS | Status: AC | PRN

## 2014-07-17 MED ORDER — MIDAZOLAM HCL 2 MG/2ML IJ SOLN
INTRAMUSCULAR | Status: AC
  Filled 2014-07-17: qty 2

## 2014-07-17 MED ORDER — SODIUM CHLORIDE 0.9 % IJ SOLN: 3.0000 mL | INTRAMUSCULAR | Status: DC | PRN

## 2014-07-17 MED ORDER — FENTANYL CITRATE (PF) 100 MCG/2ML IJ SOLN
INTRAMUSCULAR | Status: DC | PRN
  Administered 2014-07-17: 25 ug via INTRAVENOUS

## 2014-07-17 MED ORDER — SODIUM CHLORIDE 0.9 % IJ SOLN: 3.0000 mL | Freq: Two times a day (BID) | INTRAMUSCULAR | Status: DC

## 2014-07-17 MED ORDER — IOHEXOL 350 MG/ML SOLN
INTRAVENOUS | Status: DC | PRN
  Administered 2014-07-17: 30 mL via INTRA_ARTERIAL

## 2014-07-17 MED ORDER — HEPARIN (PORCINE) IN NACL 2-0.9 UNIT/ML-% IJ SOLN
INTRAMUSCULAR | Status: AC
  Filled 2014-07-17: qty 1500

## 2014-07-17 MED ORDER — ACETAMINOPHEN 325 MG PO TABS: 650.0000 mg | ORAL_TABLET | ORAL | Status: DC | PRN

## 2014-07-17 MED ORDER — MIDAZOLAM HCL 2 MG/2ML IJ SOLN
INTRAMUSCULAR | Status: DC | PRN
  Administered 2014-07-17: 1 mg via INTRAVENOUS

## 2014-07-17 MED ORDER — SODIUM CHLORIDE 0.9 % IJ SOLN
3.0000 mL | Freq: Two times a day (BID) | INTRAMUSCULAR | Status: DC
  Administered 2014-07-17 – 2014-07-19 (×3): 3 mL via INTRAVENOUS

## 2014-07-17 MED ORDER — LIDOCAINE HCL (PF) 1 % IJ SOLN
INTRAMUSCULAR | Status: AC
  Filled 2014-07-17: qty 30

## 2014-07-17 MED ORDER — ENOXAPARIN SODIUM 40 MG/0.4ML ~~LOC~~ SOLN
40.0000 mg | SUBCUTANEOUS | Status: DC
  Administered 2014-07-18 – 2014-07-19 (×2): 40 mg via SUBCUTANEOUS
  Filled 2014-07-17 (×3): qty 0.4

## 2014-07-17 MED ORDER — FENTANYL CITRATE (PF) 100 MCG/2ML IJ SOLN
INTRAMUSCULAR | Status: AC
  Filled 2014-07-17: qty 2

## 2014-07-17 MED ORDER — ONDANSETRON HCL 4 MG/2ML IJ SOLN: 4.0000 mg | Freq: Four times a day (QID) | INTRAMUSCULAR | Status: DC | PRN

## 2014-07-17 MED ORDER — SODIUM CHLORIDE 0.9 % IV SOLN: 250.0000 mL | INTRAVENOUS | Status: DC | PRN

## 2014-07-17 MED ORDER — LIDOCAINE HCL (PF) 1 % IJ SOLN
INTRAMUSCULAR | Status: DC | PRN
  Administered 2014-07-17: 30 mL via SUBCUTANEOUS

## 2014-07-17 SURGICAL SUPPLY — 10 items
CATH INFINITI 5FR MULTPACK ANG (CATHETERS) ×2 IMPLANT
CATH SWAN GANZ 7F STRAIGHT (CATHETERS) ×2 IMPLANT
KIT HEART LEFT (KITS) ×2 IMPLANT
KIT HEART RIGHT NAMIC (KITS) ×2 IMPLANT
PACK CARDIAC CATHETERIZATION (CUSTOM PROCEDURE TRAY) ×2 IMPLANT
SHEATH PINNACLE 5F 10CM (SHEATH) ×2 IMPLANT
SHEATH PINNACLE 7F 10CM (SHEATH) ×2 IMPLANT
SYR MEDRAD MARK V 150ML (SYRINGE) ×2 IMPLANT
TRANSDUCER W/STOPCOCK (MISCELLANEOUS) ×4 IMPLANT
WIRE EMERALD 3MM-J .035X150CM (WIRE) ×2 IMPLANT

## 2014-07-17 NOTE — Progress Notes (Signed)
Advanced Heart Failure Rounding Note   Subjective:    54 y/o with h/o HTN, chronic systolic HF due to presumed NICM.  Admitted from [facility] on 6/10 with decompensated HF. (Had not been seen in Clinic ~ 2 years)  Milrinone started on 6/11 due to sluggish diuresis and co-ox 44%. Delene Loll and Spiro started 07/15/14 CO-OX  47% -> 69% -> 59% -> 52.7%   Brisk diuresis noted. Weight down another 3 lbs, Out 1.1 L overnight..  -5 liters overall.  Has met with multiple members of the VAD team including a VAD patient.  Seems more sure of the process moving forward than days previous.   Objective:   Weight Range:  Vital Signs:   Temp:  [97.7 F (36.5 C)-98 F (36.7 C)] 98 F (36.7 C) (06/15 0514) Pulse Rate:  [94-124] 94 (06/15 0514) Resp:  [18] 18 (06/15 0514) BP: (109-134)/(73-92) 109/75 mmHg (06/15 0514) SpO2:  [95 %-100 %] 96 % (06/15 0514) Weight:  [109 lb 1.6 oz (49.487 kg)-112 lb 12.8 oz (51.166 kg)] 109 lb 1.6 oz (49.487 kg) (06/15 0514) Last BM Date: 07/16/14  Weight change: Filed Weights   07/16/14 0547 07/16/14 1234 07/17/14 0514  Weight: 112 lb 12.8 oz (51.166 kg) 112 lb 12.8 oz (51.166 kg) 109 lb 1.6 oz (49.487 kg)    Intake/Output:   Intake/Output Summary (Last 24 hours) at 07/17/14 1049 Last data filed at 07/17/14 0516  Gross per 24 hour  Intake 639.59 ml  Output   1150 ml  Net -510.41 ml     PHYSICAL EXAM: General: sitting up in bed. No respiratory difficulty HEENT: normal Neck: supple. JVD 5-6 . Carotids 2+ bilat; no bruits. No lymphadenopathy or thryomegaly appreciated. Cor: PMI laterally displaced. Regular rate & rhythm. No rubs, murmurs. + S3  Lungs: clear Abdomen: soft, nontender, +distended. No hepatosplenomegaly. No bruits or masses. Good bowel sounds. Extremities: no cyanosis, clubbing, rash, R and LLE 1+ edema. Ted hose in place Neuro: alert & oriented x 3, cranial nerves grossly intact. moves all 4 extremities w/o difficulty. Affect flat but  appropriate.  Telemetry: NSR  Labs: Basic Metabolic Panel:  Recent Labs Lab 07/12/14 1526 07/13/14 0425 07/14/14 0445 07/15/14 0434 07/16/14 0440 07/17/14 0439  NA 136 137 135 134* 135 132*  K 4.1 3.8 3.3* 3.5 3.6 3.8  CL 101 101 94* 89* 87* 84*  CO2 25 27 32 34* 36* 36*  GLUCOSE 152* 121* 93 105* 87 84  BUN $Re'13 13 10 10 8 7  'FbR$ CREATININE 1.15* 1.04* 1.06* 1.12* 0.94 1.03*  CALCIUM 8.9 9.0 8.9 9.3 10.1 9.4  MG 1.9  --   --   --   --   --     Liver Function Tests:  Recent Labs Lab 07/12/14 1526  AST 36  ALT 24  ALKPHOS 69  BILITOT 3.0*  PROT 6.4*  ALBUMIN 3.1*   No results for input(s): LIPASE, AMYLASE in the last 168 hours. No results for input(s): AMMONIA in the last 168 hours.  CBC:  Recent Labs Lab 07/12/14 1526 07/17/14 0439  WBC 3.6* 4.6  NEUTROABS 1.7  --   HGB 13.1 14.9  HCT 40.1 45.6  MCV 73.3* 72.6*  PLT 201 223    Cardiac Enzymes:  Recent Labs Lab 07/12/14 1526 07/12/14 2230  TROPONINI <0.03 <0.03    BNP: BNP (last 3 results)  Recent Labs  07/12/14 1526  BNP 1582.4*    ProBNP (last 3 results)  Recent Labs  06/24/14  [place] 2172.0*      Other results:  Imaging: No results found.   Medications:     Scheduled Medications: . calcium-vitamin D  1 tablet Oral Daily  . digoxin  0.125 mg Oral Daily  . enoxaparin (LOVENOX) injection  30 mg Subcutaneous Q24H  . feeding supplement (ENSURE ENLIVE)  237 mL Oral BID BM  . ferrous sulfate  325 mg Oral Q breakfast  . furosemide  40 mg Oral BID  . potassium chloride SA  20 mEq Oral BID  . sacubitril-valsartan  1 tablet Oral BID  . sodium chloride  3 mL Intravenous Q12H  . sodium chloride  3 mL Intravenous Q12H  . spironolactone  25 mg Oral Daily    Infusions: . sodium chloride 10 mL/hr at 07/17/14 0600    PRN Medications: sodium chloride, sodium chloride, acetaminophen, ondansetron (ZOFRAN) IV, sodium chloride, sodium chloride, sodium chloride,  traMADol   Assessment:   1. Acute on chronic systolic heart failure    --presumed NICM     --Echo 07/13/14. EF 20% RV moderately dilated.     --cMRI 3/10 EF 20%. Echo 22014 EF 30-35%    --refused cath in past 2. Cardiogenic shock 3. Microcytic anemia 4. Hypokalemia 5. Leg pain   Plan/Discussion:    CO-OX slightly down on decreased milrinone, but OK at 59%. Continue IV lasix+ metolazone. No bb with low output. Cont spiro 25 mg and entresto. Renal function good.    Iron stores - OK.   SPEP/UPEP pending.   Cardiac rehab following.  Long discussion about advanced therapies yesterday  including home inotropes, LVAD, and possible transplant.. She has identified 1-2 family members who could be her support team, but has not made a final decision, wants to take things slowly. VAD team and Dr. Prescott Gum seeing as well.  Given pt size considering Heartware vs HeartmateII. Still needs cMRI to exclude infiltrative disease and CT C/A/P as part of VAD w/u  R/LHC this afternoon.  Length of Stay: 5 Shirley Friar PA-C 07/17/2014, 10:49 AM Advanced Heart Failure Team Pager [phone] (M-F; 7a - 4p)  Please contact [facility] for night-coverage after hours (4p -7a ) and weekends on [email or web address]   Patient seen and examined with Oda Kilts, PA-C. We discussed all aspects of the encounter. I agree with the assessment and plan as stated above.  Improving. For cath today. Continue VAD work-up.   Glori Bickers MD

## 2014-07-17 NOTE — Progress Notes (Signed)
Pre-op Cardiac Surgery  Carotid Findings:  Findings suggest 1-39% internal carotid artery stenosis bilaterally. Vertebral arteries are patent with antegrade flow.   Lower  Extremity Right Left  Dorsalis Pedis 127-Triphasic 128-Triphasic  Anterior Tibial    Posterior Tibial 120-Triphasic 113-Triphasic  Ankle/Brachial Indices 1.23 1.24    Findings:   ABIs are within normal limits bilaterally. Left brachial artery pressure=103. Unable to assess right brachial artery pressure due to PICC location.  Bilateral lower extremity venous duplex completed. Bilateral lower extremities are negative for deep vein thrombosis. There is no evidence of Baker's cyst bilaterally.  07/17/2014  Maudry Mayhew, RVT, RDCS, RDMS

## 2014-07-17 NOTE — H&P (View-Only) (Signed)
Advanced Heart Failure Rounding Note   Subjective:    54 y/o with h/o HTN, chronic systolic HF due to presumed NICM.  Admitted from [facility] on 6/10 with decompensated HF. (Had not been seen in Clinic ~ 2 years)  Milrinone started on 6/11 due to sluggish diuresis and co-ox 44%. Delene Loll and Spiro started 07/15/14 CO-OX  47% -> 69% -> 59%   Brisk diuresis noted. Shows 9 lb weight loss, done on standing scale, confirmed with nurse.  -4.5 liters overall.    She states she has not yet chosen a "teammate" for possible LVAD placement.  Says she will continue to think on it and make a decision and have someone for [place] to talk to tonight.   Objective:   Weight Range:  Vital Signs:   Temp:  [97.3 F (36.3 C)-98.3 F (36.8 C)] 98 F (36.7 C) (06/14 0547) Pulse Rate:  [97-103] 97 (06/14 0547) Resp:  [17-18] 17 (06/14 0547) BP: (114-123)/(75-93) 122/86 mmHg (06/14 0547) SpO2:  [97 %-99 %] 97 % (06/14 0547) Weight:  [112 lb 12.8 oz (51.166 kg)] 112 lb 12.8 oz (51.166 kg) (06/14 0547) Last BM Date: 07/15/14  Weight change: Filed Weights   07/14/14 0525 07/15/14 0613 07/16/14 0547  Weight: 124 lb 3.2 oz (56.337 kg) 123 lb 8 oz (56.02 kg) 112 lb 12.8 oz (51.166 kg)    Intake/Output:   Intake/Output Summary (Last 24 hours) at 07/16/14 0825 Last data filed at 07/16/14 0600  Gross per 24 hour  Intake 1042.33 ml  Output   2226 ml  Net -1183.67 ml     PHYSICAL EXAM: General: sitting up in bed. No respiratory difficulty HEENT: normal Neck: supple. JVD 6-7 . Carotids 2+ bilat; no bruits. No lymphadenopathy or thryomegaly appreciated. Cor: PMI laterally displaced. Regular rate & rhythm. No rubs, murmurs. + S3  Lungs: clear Abdomen: soft, nontender, +distended. No hepatosplenomegaly. No bruits or masses. Good bowel sounds. Extremities: no cyanosis, clubbing, rash, R and LLE 1+ edema. Ted hose in place Neuro: alert & oriented x 3, cranial nerves grossly intact. moves all 4 extremities  w/o difficulty. Affect flat but appropriate.  Telemetry: NSR  Labs: Basic Metabolic Panel:  Recent Labs Lab 07/12/14 1526 07/13/14 0425 07/14/14 0445 07/15/14 0434 07/16/14 0440  NA 136 137 135 134* 135  K 4.1 3.8 3.3* 3.5 3.6  CL 101 101 94* 89* 87*  CO2 25 27 32 34* 36*  GLUCOSE 152* 121* 93 105* 87  BUN 13 13 10 10 8   CREATININE 1.15* 1.04* 1.06* 1.12* 0.94  CALCIUM 8.9 9.0 8.9 9.3 10.1  MG 1.9  --   --   --   --     Liver Function Tests:  Recent Labs Lab 07/12/14 1526  AST 36  ALT 24  ALKPHOS 69  BILITOT 3.0*  PROT 6.4*  ALBUMIN 3.1*   No results for input(s): LIPASE, AMYLASE in the last 168 hours. No results for input(s): AMMONIA in the last 168 hours.  CBC:  Recent Labs Lab 07/12/14 1526  WBC 3.6*  NEUTROABS 1.7  HGB 13.1  HCT 40.1  MCV 73.3*  PLT 201    Cardiac Enzymes:  Recent Labs Lab 07/12/14 1526 07/12/14 2230  TROPONINI <0.03 <0.03    BNP: BNP (last 3 results)  Recent Labs  07/12/14 1526  BNP 1582.4*    ProBNP (last 3 results)  Recent Labs  06/24/14 1642  PROBNP 2172.0*      Other results:  Imaging: No results  found.   Medications:     Scheduled Medications: . calcium-vitamin D  1 tablet Oral Daily  . digoxin  0.125 mg Oral Daily  . enoxaparin (LOVENOX) injection  30 mg Subcutaneous Q24H  . ferrous sulfate  325 mg Oral Q breakfast  . furosemide  80 mg Intravenous BID  . metolazone  2.5 mg Oral BID  . potassium chloride SA  20 mEq Oral BID  . sacubitril-valsartan  1 tablet Oral BID  . sodium chloride  3 mL Intravenous Q12H  . spironolactone  25 mg Oral Daily    Infusions: . milrinone 0.125 mcg/kg/min (07/15/14 1032)    PRN Medications: sodium chloride, acetaminophen, ondansetron (ZOFRAN) IV, sodium chloride, sodium chloride, traMADol   Assessment:   1. Acute on chronic systolic heart failure    --presumed NICM     --Echo 07/13/14. EF 20% RV moderately dilated.     --cMRI 3/10 EF 20%. Echo  22014 EF 30-35%    --refused cath in past 2. Cardiogenic shock 3. Microcytic anemia 4. Hypokalemia 5. Leg pain   Plan/Discussion:    CO-OX slightly down on decreased milrinone, but OK at 59%. Continue IV lasix+ metolazone. No bb with low output. Cont spiro 25 mg and entresto. Renal function good.    Will need cath at some point.   Iron stores - OK.   SPEP/UPEP pending.   Consult cardiac rehab.   Long discussion about advanced therapies yesterday  including home inotropes, LVAD, and possible transplant.. She has identified 1-2 family members who could be her support team, but has not made a final decision. R/LHC today vs tomorrow.  VAD team and Dr. Prescott Gum seeing as well.  Length of Stay: 4 Shirley Friar PA-C 07/16/2014, 8:25 AM Advanced Heart Failure Team Pager [phone] (M-F; 7a - 4p)  Please contact [facility] for night-coverage after hours (4p -7a ) and weekends on [email or web address]  Patient seen and examined with Oda Kilts, PA-C. We discussed all aspects of the encounter. I agree with the assessment and plan as stated above.   Relatively stable. Feels much better.  Co-ox slightly down on reduced dose milrinone. Will stop milrinone now and plan R/L cath tomorrow. Case discussed extensively at [facility] and felt to be potential VAD candidate and possibly bridge to transplant. She seems more comfortable with the idea of advanced therapies today. Will have VAD coordinator and VAD patient speak with her today. Given size may be candidate for Heartware device. Will need cMRI to exclude infiltrative disease and also CT C/A/P as part of VAD w/u.   Bensimhon, Daniel,MD 3:08 PM

## 2014-07-17 NOTE — Consult Note (Signed)
Consultation Note Date: 07/17/2014   Patient Name: Diana Bird  DOB: 1960/03/28  MRN: [id number]  Age / Sex: 54 y.o., female   PCP: Jolaine Artist, MD Referring Physician: Jolaine Artist, MD  Reason for Consultation: VAD eval  Palliative Care Assessment and Plan Summary of Established Goals of Care and Medical Treatment Preferences    Palliative Care Discussion Held Today:   Ms. Seybold is standing at side of bed when I came to visit. Complains of feet pain that is improving - dull aching pain that she believes was from swelling. She says that they feel better when rubbed. She tells me that she has received much information about VAD and is very overwhelmed with the thought of this. She says her illness and changes to her lifestyle have been a lot but she is not sure if she wants VAD or not. She has never had surgery. She tells me that she has catheterization planned today and is focusing on one day at a time and would like to get back home where she can spend some time deciding if she wants VAD. She seems very torn by the decision for VAD and will need time. Hopeful she will be able to maintain well at home but she understands that she will have to make a decision one way or another and she is hoping she has enough time to give this good thought at home.   She tells me that she lives alone but her boyfriend is supportive and stays with her sometimes. Family is out of town. She says she has been able to work a little bit lately but mostly stays in her home. She had previously led a very active lifestyle where she says she enjoyed walking, hiking, rock climbing. However, she also enjoys being in her home as she feels comfort and safe there - she does not necessarily enjoy being around a lot of people. She has lately became short of breath just walking to the bathroom in her home. She would be happy just to be able to walk around her home with ease and be able to more easily get  out of her house if she wanted too.    Contacts/Participants in Discussion: Primary Decision Maker: Self - I would encourage her to continue working with heart failure CSW for HCPOA  Goals of Care/Code Status/Advance Care Planning:   Code Status: FULL   Symptom Management:   Dyspnea: Improved - moving around room and holding conversation with ease. Continue management per heart failure team.   Bilateral feet pain: Continue walking, stretching, massage with lotion. Discussed plan with patient.   Psycho-social/Spiritual:   Support System: Boyfriend is supportive but family does not live close.   Desire for further Chaplaincy support: no  Prognosis: Unable to determine - considering advance therapies to improve prognosis  Discharge Planning:  Home when stable       Chief Complaint: Swelling  History of Present Illness: 54 y/o with h/o HTN, chronic systolic HF due to presumed NICM. Admitted from [facility] on 6/10 with decompensated HF EF 20%. (Had not been seen in Clinic ~ 2 years). Co-ox improved to 59% on milrinone. Planned heart cath today. Consideration for VAD procedure.    Primary Diagnoses  Present on Admission:  . Acute on chronic combined systolic and diastolic CHF, NYHA class 3  Palliative Review of Systems:   + bilat feet achy pain   I have reviewed the medical record, interviewed the patient and family,  and examined the patient. The following aspects are pertinent.  Past Medical History  Diagnosis Date  . Chronic systolic heart failure   . Hypertension    History   Social History  . Marital Status: Married    Spouse Name: N/A  . Number of Children: N/A  . Years of Education: N/A   Social History Main Topics  . Smoking status: Never Smoker   . Smokeless tobacco: Not on file  . Alcohol Use: Yes     Comment: 1-2 glasses of wine every other night  . Drug Use: No  . Sexual Activity: Not on file   Other Topics Concern  . None   Social History  Narrative   Works full time doing clerical work.    Family History  Problem Relation Age of Onset  . Heart failure Mother    Scheduled Meds: . calcium-vitamin D  1 tablet Oral Daily  . digoxin  0.125 mg Oral Daily  . enoxaparin (LOVENOX) injection  30 mg Subcutaneous Q24H  . feeding supplement (ENSURE ENLIVE)  237 mL Oral BID BM  . ferrous sulfate  325 mg Oral Q breakfast  . furosemide  40 mg Oral BID  . potassium chloride SA  20 mEq Oral BID  . sacubitril-valsartan  1 tablet Oral BID  . sodium chloride  3 mL Intravenous Q12H  . sodium chloride  3 mL Intravenous Q12H  . spironolactone  25 mg Oral Daily   Continuous Infusions: . sodium chloride 10 mL/hr at 07/17/14 0600   PRN Meds:.sodium chloride, sodium chloride, acetaminophen, ondansetron (ZOFRAN) IV, sodium chloride, sodium chloride, sodium chloride, traMADol Medications Prior to Admission:  Prior to Admission medications   Medication Sig Start Date End Date Taking? Authorizing Provider  Calcium Carb-Cholecalciferol (CALCIUM-VITAMIN D) 600-400 MG-UNIT TABS Take 1 tablet by mouth daily.   Yes Historical Provider, MD  carvedilol (COREG) 6.25 MG tablet Take 1.5 tablets (9.375 mg total) by mouth 2 (two) times daily with a meal. 12/04/12  Yes Rande Brunt, NP  digoxin (LANOXIN) 0.125 MG tablet Take 0.125 mg by mouth daily.   Yes Historical Provider, MD  ferrous sulfate 325 (65 FE) MG tablet Take 325 mg by mouth daily with breakfast.    Yes Historical Provider, MD  furosemide (LASIX) 40 MG tablet Take 2 tablets (80 mg total) by mouth 2 (two) times daily. 06/24/14  Yes Sueanne Margarita, MD  lisinopril (PRINIVIL,ZESTRIL) 10 MG tablet TAKE 1 TABLET BY MOUTH TWICE DAILY 02/08/14  Yes Larey Dresser, MD  potassium chloride SA (K-DUR,KLOR-CON) 20 MEQ tablet Take 20 mEq by mouth daily.    Yes Historical Provider, MD   No Known Allergies CBC:    Component Value Date/Time   WBC 4.6 07/17/2014 0439   HGB 14.9 07/17/2014 0439   HCT 45.6  07/17/2014 0439   PLT 223 07/17/2014 0439   MCV 72.6* 07/17/2014 0439   NEUTROABS 1.7 07/12/2014 1526   LYMPHSABS 1.5 07/12/2014 1526   MONOABS 0.4 07/12/2014 1526   EOSABS 0.0 07/12/2014 1526   BASOSABS 0.0 07/12/2014 1526   Comprehensive Metabolic Panel:    Component Value Date/Time   NA 132* 07/17/2014 0439   K 3.8 07/17/2014 0439   CL 84* 07/17/2014 0439   CO2 36* 07/17/2014 0439   BUN 7 07/17/2014 0439   CREATININE 1.03* 07/17/2014 0439   GLUCOSE 84 07/17/2014 0439   CALCIUM 9.4 07/17/2014 0439   AST 36 07/12/2014 1526   ALT 24 07/12/2014 1526  ALKPHOS 69 07/12/2014 1526   BILITOT 3.0* 07/12/2014 1526   PROT 6.4* 07/12/2014 1526   ALBUMIN 3.1* 07/12/2014 1526    Physical Exam:  Vital Signs: BP 109/75 mmHg  Pulse 94  Temp(Src) 98 F (36.7 C) (Oral)  Resp 18  Ht 5\' 2"  (1.575 m)  Wt 49.487 kg (109 lb 1.6 oz)  BMI 19.95 kg/m2  SpO2 96% SpO2: SpO2: 96 % O2 Device: O2 Device: Not Delivered O2 Flow Rate:   Intake/output summary:  Intake/Output Summary (Last 24 hours) at 07/17/14 1022 Last data filed at 07/17/14 0516  Gross per 24 hour  Intake 639.59 ml  Output   1150 ml  Net -510.41 ml   LBM: Last BM Date: 07/16/14 Baseline Weight: Weight: 58.1 kg (128 lb 1.4 oz) Most recent weight: Weight: 49.487 kg (109 lb 1.6 oz)  Exam Findings:   General: NAD, thin HEENT: [place]/AT, +JVD CVS: RRR Resp: No labored breathing, walks to bathroom with ease Abd: Soft, NT, ND Extrem: BLE trace edema Neuro: Awake, alert, oriented x 3           Palliative Performance Scale: 60 %                Additional Data Reviewed: Recent Labs     07/16/14  0440  07/17/14  0439  WBC   --   4.6  HGB   --   14.9  PLT   --   223  NA  135  132*  BUN  8  7  CREATININE  0.94  1.03*     Time In: 1010 Time Out: 1110 Time Total: 55min  Greater than 50%  of this time was spent counseling and coordinating care related to the above assessment and plan.   Signed by:  Vinie Sill, NP Palliative Medicine Team Pager # [phone] (M-F 8a-5p) Team Phone # [phone] (Nights/Weekends)

## 2014-07-17 NOTE — Interval H&P Note (Signed)
History and Physical Interval Note:  07/17/2014 2:02 PM  Diana Bird  has presented today for surgery, with the diagnosis of chf  The various methods of treatment have been discussed with the patient and family. After consideration of risks, benefits and other options for treatment, the patient has consented to  Procedure(s): Right/Left Heart Cath and Coronary Angiography (N/A) as a surgical intervention .  The patient's history has been reviewed, patient examined, no change in status, stable for surgery.  I have reviewed the patient's chart and labs.  Questions were answered to the patient's satisfaction.     Braiden Presutti, Quillian Quince

## 2014-07-17 NOTE — Progress Notes (Signed)
Procedure explained to patient and Rt femoral arterial and venous  access site assessed: level 0, palpable dorsalis pedis. 22fr and 81fr  Sheath removed and manual pressure applied for 20  minutes. Pre, peri, & post procedural vitals: HR 90, RR 18, O2 Sat upper 99, BP 118/80, Pain 0. Distal pulses remained intact after sheath removal. Access site level 0 and dressed with 4X4 gauze and tegaderm.  Post procedural instructions discussed and return demonstration from patient.

## 2014-07-18 ENCOUNTER — Inpatient Hospital Stay (HOSPITAL_COMMUNITY): Payer: BC Managed Care – PPO

## 2014-07-18 DIAGNOSIS — E876 Hypokalemia: Secondary | ICD-10-CM

## 2014-07-18 DIAGNOSIS — Z01818 Encounter for other preprocedural examination: Secondary | ICD-10-CM

## 2014-07-18 DIAGNOSIS — D649 Anemia, unspecified: Secondary | ICD-10-CM

## 2014-07-18 DIAGNOSIS — I429 Cardiomyopathy, unspecified: Secondary | ICD-10-CM

## 2014-07-18 DIAGNOSIS — I2589 Other forms of chronic ischemic heart disease: Secondary | ICD-10-CM

## 2014-07-18 LAB — BASIC METABOLIC PANEL
ANION GAP: 10 (ref 5–15)
BUN: 8 mg/dL (ref 6–20)
CHLORIDE: 92 mmol/L — AB (ref 101–111)
CO2: 35 mmol/L — AB (ref 22–32)
Calcium: 8.8 mg/dL — ABNORMAL LOW (ref 8.9–10.3)
Creatinine, Ser: 0.9 mg/dL (ref 0.44–1.00)
GFR calc Af Amer: >60 mL/min (ref >60)
GFR calc non Af Amer: >60 mL/min (ref >60)
Glucose, Bld: 75 mg/dL (ref 65–99)
POTASSIUM: 4 mmol/L (ref 3.5–5.1)
SODIUM: 137 mmol/L (ref 135–145)

## 2014-07-18 LAB — PLATELET INHIBITION P2Y12: Platelet Function  P2Y12: 220 [PRU] (ref 194–418)

## 2014-07-18 LAB — POCT I-STAT 3, ART BLOOD GAS (G3+)
ACID-BASE EXCESS: 7 mmol/L — AB (ref 0.0–2.0)
Bicarbonate: 32.2 mEq/L — ABNORMAL HIGH (ref 20.0–24.0)
O2 SAT: 99 %
TCO2: 34 mmol/L (ref 0–100)
pCO2 arterial: 45.1 mmHg — ABNORMAL HIGH (ref 35.0–45.0)
pH, Arterial: 7.462 — ABNORMAL HIGH (ref 7.350–7.450)
pO2, Arterial: 125 mmHg — ABNORMAL HIGH (ref 80.0–100.0)

## 2014-07-18 LAB — HEMOGLOBIN A1C
HEMOGLOBIN A1C: 7.4 % — AB (ref 4.8–5.6)
MEAN PLASMA GLUCOSE: 166 mg/dL

## 2014-07-18 MED ORDER — GADOBENATE DIMEGLUMINE 529 MG/ML IV SOLN
15.0000 mL | Freq: Once | INTRAVENOUS | Status: AC
  Administered 2014-07-18: 15 mL via INTRAVENOUS

## 2014-07-18 MED FILL — Heparin Sodium (Porcine) 2 Unit/ML in Sodium Chloride 0.9%: INTRAMUSCULAR | Qty: 1500 | Status: AC

## 2014-07-18 NOTE — Consult Note (Signed)
[address]       [place] [address]             [phone]        Quinlynn Yassin [place] Medical Record #[id number] Date of Birth: [birth year]  Referring: No ref. provider found Primary Care: Glori Bickers, MD Patient examined, images from echocardiogram, chest CT scan, cardiac MRI were personally reviewed.  Chief complaint- shortness of breath, peripheral edema, decreased exercise tolerance  History of Present Illness:     The patient is a 54 year old AA female with advanced class IV nonischemic cardiomyopathy who was admitted to the hospital from the advanced heart failure clinic after presenting after approximately 2 years from her previous visit. She was diagnosed with heart failure in 2011 and has been managed medically. She has had no problems with V. tach and has remained employed for [employer] and fairly active and compensated until recently. She has developed weight gain, lower extremity and abdominal edema, dyspnea on exertion and orthopnea and significant decrease in exercise tolerance--walking to the bathroom in her home results in shortness of breath. She is maintained sinus rhythm does not have an AICD.  She was diuresing on admission and mixed venous oxygen saturation was only 40%. She was placed on milrinone with significant improvement of mixed venous saturation to 60%. Echocardiogram demonstrates EF of 15% with moderate RV dysfunction. She underwent coronary angiograms which showed no significant CAD. She underwent right heart catheterization which demonstrated normal right-sided pressures and cardiac index of 1. 9-2 0.0. This was performed off milrinone. She underwent CT scan of chest which shows no significant mediastinal adenopathy or pulmonary parenchymal disease. She underwent cardiac MRI to evaluate for possible amyloid infiltrative myocardial process-report is pending.  Patient has had no previous major surgery. She denies any previous  significant thoracic trauma. She is a nonsmoker and nondrinker. She is very independent and used an active lifestyle up until her recent progression and heart failure. She lives alone but has a boyfriend and an adult son.  She denies previous bleeding problems, blood in her stool or atrial fibrillation. She has never been on Coumadin. She did not have a colonoscopy when she turned 21. She does not see a dentist regularly and has several broken teeth in her right maxilla.  Current Activity/ Functional Status: Up until recently the patient was fully employed and independent now she has class for CHF and reduced exercise tolerance   Zubrod Score: At the time of surgery this patient's most appropriate activity status/level should be described as: []     0    Normal activity, no symptoms []     1    Restricted in physical strenuous activity but ambulatory, able to do out light work []     2    Ambulatory and capable of self care, unable to do work activities, up and about                 more than 50%  Of the time                            [x]     3    Only limited self care, in bed greater than 50% of waking hours []     4    Completely disabled, no self care, confined to bed or chair []     5    Moribund  Past Medical History  Diagnosis Date  .  Chronic systolic heart failure   . Hypertension     Past Surgical History  Procedure Laterality Date  . None    . Cardiac catheterization N/A 07/17/2014    Procedure: Right/Left Heart Cath and Coronary Angiography;  Surgeon: Jolaine Artist, MD;  Location: [facility];  Service: Cardiovascular;  Laterality: N/A;    History  Smoking status  . Never Smoker   Smokeless tobacco  . Not on file    History  Alcohol Use  . Yes    Comment: 1-2 glasses of wine every other night    History   Social History  . Marital Status: Married    Spouse Name: N/A  . Number of Children: N/A  . Years of Education: N/A   Occupational History  . Not  on file.   Social History Main Topics  . Smoking status: Never Smoker   . Smokeless tobacco: Not on file  . Alcohol Use: Yes     Comment: 1-2 glasses of wine every other night  . Drug Use: No  . Sexual Activity: Not on file   Other Topics Concern  . Not on file   Social History Narrative   Works full time doing clerical work.     No Known Allergies  Current Facility-Administered Medications  Medication Dose Route Frequency Provider Last Rate Last Dose  . 0.9 %  sodium chloride infusion  250 mL Intravenous PRN Amy D Clegg, NP      . 0.9 %  sodium chloride infusion  250 mL Intravenous PRN Jolaine Artist, MD      . acetaminophen (TYLENOL) tablet 650 mg  650 mg Oral Q4H PRN Conrad [place], NP   650 mg at 07/15/14 0627  . acetaminophen (TYLENOL) tablet 650 mg  650 mg Oral Q4H PRN Jolaine Artist, MD      . calcium-vitamin D (OSCAL WITH D) 500-200 MG-UNIT per tablet 1 tablet  1 tablet Oral Daily Amy D Clegg, NP   1 tablet at 07/18/14 1200  . digoxin (LANOXIN) tablet 0.125 mg  0.125 mg Oral Daily Amy D Clegg, NP   0.125 mg at 07/18/14 1158  . enoxaparin (LOVENOX) injection 40 mg  40 mg Subcutaneous Q24H Jolaine Artist, MD      . feeding supplement (ENSURE ENLIVE) (ENSURE ENLIVE) liquid 237 mL  237 mL Oral BID BM Baird Lyons, RD   237 mL at 07/18/14 1000  . ferrous sulfate tablet 325 mg  325 mg Oral Q breakfast Jolaine Artist, MD   325 mg at 07/18/14 1157  . furosemide (LASIX) tablet 40 mg  40 mg Oral BID Jolaine Artist, MD   40 mg at 07/18/14 1157  . ondansetron (ZOFRAN) injection 4 mg  4 mg Intravenous Q6H PRN Jolaine Artist, MD      . potassium chloride SA (K-DUR,KLOR-CON) CR tablet 20 mEq  20 mEq Oral BID Jolaine Artist, MD   20 mEq at 07/18/14 1200  . sacubitril-valsartan (ENTRESTO) 49-51 mg per tablet  1 tablet Oral BID Jolaine Artist, MD   1 tablet at 07/18/14 1157  . sodium chloride 0.9 % injection 10-40 mL  10-40 mL Intracatheter PRN Jolaine Artist, MD   10 mL at 07/18/14 0525  . sodium chloride 0.9 % injection 3 mL  3 mL Intravenous Q12H Amy D Clegg, NP   3 mL at 07/17/14 2153  . sodium chloride 0.9 % injection 3 mL  3  mL Intravenous PRN Amy D Clegg, NP      . sodium chloride 0.9 % injection 3 mL  3 mL Intravenous Q12H Jolaine Artist, MD   3 mL at 07/18/14 1202  . sodium chloride 0.9 % injection 3 mL  3 mL Intravenous PRN Jolaine Artist, MD      . spironolactone (ALDACTONE) tablet 25 mg  25 mg Oral Daily Amy D Clegg, NP   25 mg at 07/18/14 1159  . traMADol (ULTRAM) tablet 50 mg  50 mg Oral Q6H PRN Conrad [place], NP   50 mg at 07/17/14 2156    Prescriptions prior to admission  Medication Sig Dispense Refill Last Dose  . Calcium Carb-Cholecalciferol (CALCIUM-VITAMIN D) 600-400 MG-UNIT TABS Take 1 tablet by mouth daily.   07/11/2014 at Unknown time  . carvedilol (COREG) 6.25 MG tablet Take 1.5 tablets (9.375 mg total) by mouth 2 (two) times daily with a meal. 90 tablet 6 07/11/2014 at 2200  . digoxin (LANOXIN) 0.125 MG tablet Take 0.125 mg by mouth daily.   07/11/2014 at Unknown time  . ferrous sulfate 325 (65 FE) MG tablet Take 325 mg by mouth daily with breakfast.    07/11/2014 at Unknown time  . furosemide (LASIX) 40 MG tablet Take 2 tablets (80 mg total) by mouth 2 (two) times daily. 120 tablet 1 07/11/2014 at Unknown time  . lisinopril (PRINIVIL,ZESTRIL) 10 MG tablet TAKE 1 TABLET BY MOUTH TWICE DAILY 60 tablet 0 07/11/2014 at Unknown time  . potassium chloride SA (K-DUR,KLOR-CON) 20 MEQ tablet Take 20 mEq by mouth daily.    07/11/2014 at Unknown time    Family History  Problem Relation Age of Onset  . Heart failure Mother      Review of Systems:       Cardiac Review of Systems: Y or N  Chest Pain [ y   ]  Resting SOB [ y  ] Exertional SOB  [ y ]  Vertell Limber Blue.Reese  ]   Pedal Edema [ n  ]    Palpitations n[  ] Syncope  [ n ]   Presyncope [ n  ]  General Review of Systems: [Y] = yes [  ]=no Constitional: recent weight change [y  increased 10 pounds   ]; anorexia [ y ]; fatigue [ y ]; nausea [  ]; night sweats [  ]; fever [  ]; or chills [  ]                                                               Dental: poor dentition[y  ]; Last Dentist visit: Greater than one year  Eye : blurred vision [  ]; diplopia [   ]; vision changes [  ];  Amaurosis fugax[  ]; Resp: cough [  ];  wheezing[  ];  hemoptysis[  ]; shortness of breath[y  ]; paroxysmal nocturnal dyspnea[ y ]; dyspnea on exertion[ y ]; or orthopnea[y  ];  GI:  gallstones[  ], vomiting[  ];  dysphagia[  ]; melena[ n ];  hematochezia [  ]; heartburn[  ];   Hx of  Colonoscopy[  ]; GU: kidney stones [  ]; hematuria[  ];   dysuria [  ];  nocturia[  ];  history of     obstruction [  ]; urinary frequency [  ]             Skin: rash, swelling[  ];, hair loss[  ];  peripheral edema[  ];  or itching[  ]; Musculosketetal: myalgias[  ];  joint swelling[  ];  joint erythema[  ];  joint pain[  ];  back pain[  ];  Heme/Lymph: bruising[  ];  bleeding[  ];  anemia[  ];  Neuro: TIA[  ];  headaches[  ];  stroke[  ];  vertigo[  ];  seizures[  ];   paresthesias[  ];  difficulty walking[  ];  Psych:depression[  ]; anxiety[  ];  Endocrine: diabetes[  ];  thyroid dysfunction[  ];  Immunizations: Flu [  ]; Pneumococcal[  ];  Other:  Right hand dominant no previous colonoscopy  Physical Exam: BP 107/63 mmHg  Pulse 106  Temp(Src) 97.5 F (36.4 C) (Oral)  Resp 18  Ht 5\' 2"  (1.575 m)  Wt 109 lb 1.6 oz (49.487 kg)  BMI 19.95 kg/m2  SpO2 98%       Physical Exam  General: Pleasant small body size middle-aged AA female no acute distress HEENT: Normocephalic pupils equal , dentition adequate Neck: Supple with 2+ JVD, no adenopathy, or bruit Chest: Clear to auscultation, symmetrical breath sounds, no rhonchi, no tenderness             or deformity Cardiovascular: Regular rate and rhythm, no murmur, +S-3  gallop, peripheral pulses             palpable in all extremities Abdomen:   Soft, nontender, no palpable mass or organomegaly Extremities: Warm, well-perfused, no clubbing cyanosis  mild ankle edema without calf  tenderness,              no venous stasis changes of the legs Rectal/GU: Deferred Neuro: Grossly non--focal and symmetrical throughout Skin: Clean and dry without rash or ulceration   Diagnostic Studies & Laboratory data:     Recent Radiology Findings:   Ct Abdomen Pelvis Wo Contrast  07/17/2014   CLINICAL DATA:  Evaluation for left ventricular assist device. History of heart failure.  EXAM: CT CHEST, ABDOMEN AND PELVIS WITHOUT CONTRAST  TECHNIQUE: Multidetector CT imaging of the chest, abdomen and pelvis was performed following the standard protocol without IV contrast.  COMPARISON:  Chest CT 02/19/2009  FINDINGS: CT CHEST FINDINGS  Right-sided PICC line present with tip in the SVC just above the cavoatrial junction. Exam demonstrates mild right apical pleural thickening unchanged. Lungs are adequately inflated without focal consolidation or effusion. Subtle linear scarring over the lingula. 3 mm subpleural nodule over the anterior left upper lobe unchanged compared to 2011. Subtle debris along the left lateral wall of the trachea which may be due to aspiration.  There is mild to moderate cardiomegaly with a small pericardial effusion. No significant hilar, mediastinal or axillary adenopathy. Remaining bones and soft tissues within normal.  CT ABDOMEN AND PELVIS FINDINGS  The liver, spleen, pancreas, gallbladder and adrenal glands are within normal. Kidneys are normal in size without hydronephrosis or nephrolithiasis. Ureters are within normal. The appendix is normal. Abdominal aorta is within normal. Minimal diverticulosis of the colon  Pelvic images demonstrate a suggestion of a few small uterine fibroids with the largest over the fundus with partial calcification measuring approximately 2.8 cm. Ovaries are within normal. There is a small amount of free fluid in the  pelvis. Remaining bones and soft  tissues are within normal.  IMPRESSION: No acute findings in the chest, abdomen or pelvis.  Moderate cardiomegaly with small pericardial effusion.  Subtle debris along the left lateral wall of the trachea which may represent aspirated material.  Mild diverticulosis of the colon.  2.8 cm fundal fibroid.  Mild amount of free pelvic fluid.   Electronically Signed   By: Marin Olp M.D.   On: 07/17/2014 13:26   Dg Orthopantogram  07/17/2014   CLINICAL DATA:  Preoperative films for LVAD surgery. No jaw complaints.  EXAM: ORTHOPANTOGRAM/PANORAMIC  COMPARISON:  None.  FINDINGS: Mandible is within normal. Possible postsurgical change over the root of the most posterior left lower molar tooth. Mild opacification the region of the left maxillary sinus which may represent mild inflammatory change. Remaining bones and soft tissues are within normal.  IMPRESSION: No acute findings.   Electronically Signed   By: Marin Olp M.D.   On: 07/17/2014 13:31   Dg Chest 2 View  07/17/2014   CLINICAL DATA:  Preoperative evaluation for left ventricular assist device placement  EXAM: CHEST  2 VIEW  COMPARISON:  Chest radiograph July 13, 2014; chest CT July 17, 2014  FINDINGS: There is no edema or consolidation. There is cardiac enlargement with pulmonary vascularity within normal limits. No adenopathy. Central catheter tip is in the superior vena cava. No pneumothorax. No bone lesions.  IMPRESSION: Cardiac enlargement.  No edema or consolidation.   Electronically Signed   By: Lowella Grip III M.D.   On: 07/17/2014 13:32   Ct Chest Wo Contrast  07/17/2014   CLINICAL DATA:  Evaluation for left ventricular assist device. History of heart failure.  EXAM: CT CHEST, ABDOMEN AND PELVIS WITHOUT CONTRAST  TECHNIQUE: Multidetector CT imaging of the chest, abdomen and pelvis was performed following the standard protocol without IV contrast.  COMPARISON:  Chest CT 02/19/2009  FINDINGS: CT CHEST  FINDINGS  Right-sided PICC line present with tip in the SVC just above the cavoatrial junction. Exam demonstrates mild right apical pleural thickening unchanged. Lungs are adequately inflated without focal consolidation or effusion. Subtle linear scarring over the lingula. 3 mm subpleural nodule over the anterior left upper lobe unchanged compared to 2011. Subtle debris along the left lateral wall of the trachea which may be due to aspiration.  There is mild to moderate cardiomegaly with a small pericardial effusion. No significant hilar, mediastinal or axillary adenopathy. Remaining bones and soft tissues within normal.  CT ABDOMEN AND PELVIS FINDINGS  The liver, spleen, pancreas, gallbladder and adrenal glands are within normal. Kidneys are normal in size without hydronephrosis or nephrolithiasis. Ureters are within normal. The appendix is normal. Abdominal aorta is within normal. Minimal diverticulosis of the colon  Pelvic images demonstrate a suggestion of a few small uterine fibroids with the largest over the fundus with partial calcification measuring approximately 2.8 cm. Ovaries are within normal. There is a small amount of free fluid in the pelvis. Remaining bones and soft tissues are within normal.  IMPRESSION: No acute findings in the chest, abdomen or pelvis.  Moderate cardiomegaly with small pericardial effusion.  Subtle debris along the left lateral wall of the trachea which may represent aspirated material.  Mild diverticulosis of the colon.  2.8 cm fundal fibroid.  Mild amount of free pelvic fluid.   Electronically Signed   By: Marin Olp M.D.   On: 07/17/2014 13:26     I have independently reviewed the above radiologic studies.  Recent Lab Findings: Lab Results  Component Value Date   WBC 4.6 07/17/2014   HGB 14.9 07/17/2014   HCT 45.6 07/17/2014   PLT 223 07/17/2014   GLUCOSE 75 07/18/2014   CHOL 94 07/17/2014   TRIG 137 07/17/2014   HDL 27* 07/17/2014   LDLCALC 40 07/17/2014    ALT 24 07/12/2014   AST 36 07/12/2014   NA 137 07/18/2014   K 4.0 07/18/2014   CL 92* 07/18/2014   CREATININE 0.90 07/18/2014   BUN 8 07/18/2014   CO2 35* 07/18/2014   TSH 1.882 07/12/2014   INR 1.11 07/17/2014   HGBA1C 7.4* 07/17/2014      Assessment / Plan:     Advanced class IV heart failure with acute on chronic systolic heart failure from nonischemic cardiomyopathy  Moderate RV dysfunction  The patient has well compensated heart failure currently with improved symptoms since her admission. Right heart catheterization is almost normal despite her low ejection fraction of 15%. She will probably not need home milrinone but w will need close follow-up in the heart failure clinic as she has had a two-year gap in her follow-up.  The option of implantable LVAD has been presented to the patient and she is definitely interested. I recommend that we proceed with the full LVAD evaluation as she may have sudden deterioration in her clinical status. She will need colonoscopy and dental evaluation, PFTs and pre-VAD Dopplers. She will need to identify her support person and be evaluated by the social worker. All of these parts of her evaluation could be done as an outpatient. The patient could also be evaluated for cardiac transplantation at a regional cardiac transplant center such as [facility] if her cardiac MRI shows no evidence of amyloidosis.  Because of the patient's low BSA <1.6 she would need a small ventricular assist device ( HVAD ) for optimal clinical benefit and to optimize her long-term survival if advanced mechanical support is needed and she accepts.  We will follow with the advanced heart failure service and VAD team as outpatient.     @ME1 @ 07/18/2014 3:48 PM

## 2014-07-18 NOTE — Progress Notes (Signed)
CARDIAC REHAB PHASE I   PRE:  Rate/Rhythm: 100 ST    BP: sitting 100/70    SaO2: 100 RA  MODE:  Ambulation: 750 ft   POST:  Rate/Rhythm: 121 ST with PVC    BP: sitting 108/74     SaO2: 99 RA  Pt walked without problems, HR elevated to 121 ST with PVC. Took pt to [facility] and she sat and talked for 20 min, processing all that is on her mind. Would like to go back to [facility] if ok with nursing. Pt weighing daily and sts she maintains a 1500 ml sodium diet. Pt feels she has been getting more that 1500 ml a meal here and wants to discuss this with the dietician. Will f/u as time allows. [id number]  Josephina Shih [place] CES, ACSM 07/18/2014 2:19 PM

## 2014-07-18 NOTE — Progress Notes (Signed)
Dr. Claiborne Billings made aware re 8 beats of Vtach. Pt asymptomatic.continued to monitor pt

## 2014-07-18 NOTE — Progress Notes (Signed)
Daily Progress Note   Patient Name: Diana Bird       Date: 07/18/2014 DOB: July 24, 1960  Age: 54 y.o. MRN#: [id number] Attending Physician: Jolaine Artist, MD Primary Care Physician: Glori Bickers, MD Admit Date: 07/12/2014  Reason for Consultation/Follow-up: VAD  Subjective:     Diana Bird is very fatigued today although she has had extensive testing with cardiac cath, CT, MRI last couple days. She is happy to have the cath over with and says now she can begin to think about LVAD more. She has no concerns/complaints but admits she is very fearful of LVAD. She is looking forward to hopefully discharging home soon where she can focus more on a decision. Emotional support provided.    Length of Stay: 6 days  Current Medications: Scheduled Meds:  . calcium-vitamin D  1 tablet Oral Daily  . digoxin  0.125 mg Oral Daily  . enoxaparin (LOVENOX) injection  40 mg Subcutaneous Q24H  . feeding supplement (ENSURE ENLIVE)  237 mL Oral BID BM  . ferrous sulfate  325 mg Oral Q breakfast  . furosemide  40 mg Oral BID  . potassium chloride SA  20 mEq Oral BID  . sacubitril-valsartan  1 tablet Oral BID  . sodium chloride  3 mL Intravenous Q12H  . sodium chloride  3 mL Intravenous Q12H  . spironolactone  25 mg Oral Daily    Continuous Infusions:    PRN Meds: sodium chloride, sodium chloride, acetaminophen, acetaminophen, ondansetron (ZOFRAN) IV, sodium chloride, sodium chloride, sodium chloride, traMADol  Palliative Performance Scale: 60%     Vital Signs: BP 107/63 mmHg  Pulse 106  Temp(Src) 97.5 F (36.4 C) (Oral)  Resp 18  Ht 5\' 2"  (1.575 m)  Wt 49.487 kg (109 lb 1.6 oz)  BMI 19.95 kg/m2  SpO2 98% SpO2: SpO2: 98 % O2 Device: O2 Device: Not Delivered O2 Flow Rate: O2 Flow Rate (L/min): 2 L/min  Intake/output summary:  Intake/Output Summary (Last 24 hours) at 07/18/14 1541 Last data filed at 07/18/14 1500  Gross per 24 hour  Intake    493 ml  Output     500 ml  Net     -7 ml   LBM: Last BM Date: 07/16/14 Baseline Weight: Weight: 58.1 kg (128 lb 1.4 oz) Most recent weight: Weight: 49.487 kg (109 lb 1.6 oz)  Physical Exam: General: NAD, thin HEENT: [place]/AT, +JVD CVS: RRR Resp: No labored breathing, walks to bathroom with ease Abd: Soft, NT, ND Extrem: BLE trace edema Neuro: Awake, alert, oriented x 3    Additional Data Reviewed: Recent Labs     07/17/14  0439  07/18/14  0525  WBC  4.6   --   HGB  14.9   --   PLT  223   --   NA  132*  137  BUN  7  8  CREATININE  1.03*  0.90     Problem List:  Patient Active Problem List   Diagnosis Date Noted  . Palliative care encounter 07/17/2014  . Acute on chronic combined systolic and diastolic CHF, NYHA class 3 07/12/2014  . Acute on chronic systolic and diastolic heart failure, NYHA class 3 06/24/2014  . DCM (dilated cardiomyopathy) 06/24/2014  . Benign essential HTN 06/24/2014  . SYSTOLIC HEART FAILURE, CHRONIC 06/04/2008     Palliative Care Assessment & Plan    Code Status:  Full code  Goals of Care:  Potential LVAD candidate.   Desire for further Chaplaincy support: no  3. Symptom Management:  Dyspnea: Improved - moving around room and holding conversation with ease. Continue management per heart failure team.   Bilateral feet pain: Continue walking, stretching, massage with lotion. Improving each day.   4. Prognosis: Unable to determine  5. Discharge Planning: Home with Home Health    Thank you for allowing the Palliative Medicine Team to assist in the care of this patient.   Time In: 1015 Time Out: 1035 Total Time 27min Prolonged Time Billed  no     Greater than 50%  of this time was spent counseling and coordinating care related to the above assessment and plan.     Vinie Sill, NP Palliative Medicine Team Pager # [phone] (M-F 8a-5p) Team Phone # [phone] (Nights/Weekends)  07/18/2014, 6:70 PM        Vinie Sill,  NP Palliative Medicine Team Pager # [phone] (M-F 8a-5p) Team Phone # [phone] (Nights/Weekends)

## 2014-07-18 NOTE — Progress Notes (Signed)
UR COMPLETED  

## 2014-07-18 NOTE — Progress Notes (Signed)
LVAD Coordinator Note:  LVAD evaluation in progress--CT scans, RHC/LHC, doppler studies completed. Cardiac MRI  And pulmonary function testing to be completed prior to D/C.   She has not had a colonoscopy in the past; if she is still deemed a candidate per the VAD team after the non-invasive testing will arrange for her to have study done outpatient.   Will continue to follow and offer support through the process.   Janene Madeira, RN VAD Coordinator   Office: [phone] 24/7 VAD Pager: [phone]

## 2014-07-18 NOTE — Progress Notes (Signed)
Nutrition Follow-up  INTERVENTION:  Ensure Enlive (each supplement provides 350kcal and 20 grams of protein), Snacks  NUTRITION DIAGNOSIS:  Unintentional weight loss related to chronic illness as evidenced by percent weight loss.  Ongoing  GOAL:  Patient will meet greater than or equal to 90% of their needs  Unmet  MONITOR:  PO intake, Supplement acceptance, Labs, Weight trends, I & O's  REASON FOR ASSESSMENT:  Consult Assessment of nutrition requirement/status  ASSESSMENT: 54 y/o with h/o HTN, chronic systolic HF due to presumed NICM.  Pt's weight has dropped an additional 3 lbs in the past 2 days. Pt resting at time of visit and did not want to talk much. She was made NPO yesterday for procedure and only received clear liquids this morning. She has not yet tried Ensure. RD discussed pt's weight loss and encouraged increased PO intake. Pt agreeable to continuing snacks and Ensure supplements.   Labs: low chloride, elevated LDH, hemoglobin A1C of 7.4%  Height:  Ht Readings from Last 1 Encounters:  07/16/14 5\' 2"  (1.575 m)    Weight:  Wt Readings from Last 1 Encounters:  07/17/14 109 lb 1.6 oz (49.487 kg)    Ideal Body Weight:  50 kg  Wt Readings from Last 10 Encounters:  07/17/14 109 lb 1.6 oz (49.487 kg)  07/12/14 129 lb 8 oz (58.741 kg)  06/24/14 126 lb (57.153 kg)  01/03/13 128 lb (58.06 kg)  12/04/12 126 lb (57.153 kg)  06/04/08 114 lb (51.71 kg)    BMI:  Body mass index is 19.95 kg/(m^2).  Estimated Nutritional Needs:  Kcal:  1600-1800  Protein:  60-70 grams  Fluid:  1.6-1.8 L/day  Skin:  Reviewed, no issues; +2 RLE and LLE edema per nursing notes  Diet Order:  Diet Heart Room service appropriate?: Yes; Fluid consistency:: Thin  EDUCATION NEEDS:  No education needs identified at this time   Intake/Output Summary (Last 24 hours) at 07/18/14 1208 Last data filed at 07/18/14 0525  Gross per 24 hour  Intake    253 ml  Output    900 ml   Net   -647 ml    Last BM:  6/14  Pryor Ochoa RD, LDN Inpatient Clinical Dietitian Pager: [phone] After Hours Pager: [phone]

## 2014-07-18 NOTE — Progress Notes (Signed)
Advanced Heart Failure Rounding Note   Subjective:    54 y/o with h/o HTN, chronic systolic HF due to presumed NICM.  Admitted from [facility] on 6/10 with decompensated HF. (Had not been seen in Clinic ~ 2 years)  Milrinone started on 6/11 due to sluggish diuresis and co-ox 44%. Delene Loll and Spiro started 07/15/14 CO-OX  47% -> 69% -> 59% -> 52.7% -> 70%  Brisk diuresis noted. Will get weight for today. Out 0.6 L overnight..  -5.5 liters overall.  Has met with multiple members of the VAD team including a VAD patient.  Had reassuring L/RHC yesterday pertaining to [place] candidacy. cMRI this morning, CTs last night. To obtain PFTs prior to d/c.  Feeling okay today. No new complaints. Said that Colletta [place] is coming back today and expects to see Dr. Prescott Gum as well.     Objective:   Weight Range:  Vital Signs:   Temp:  [98.2 F (36.8 C)] 98.2 F (36.8 C) (06/15 2036) Pulse Rate:  [64-96] 94 (06/15 2036) Resp:  [16-18] 18 (06/15 2036) BP: (103-153)/(67-93) 103/67 mmHg (06/15 2036) SpO2:  [96 %-100 %] 96 % (06/15 2036) Last BM Date: 07/16/14  Weight change: Filed Weights   07/16/14 0547 07/16/14 1234 07/17/14 0514  Weight: 112 lb 12.8 oz (51.166 kg) 112 lb 12.8 oz (51.166 kg) 109 lb 1.6 oz (49.487 kg)    Intake/Output:   Intake/Output Summary (Last 24 hours) at 07/18/14 0728 Last data filed at 07/18/14 0525  Gross per 24 hour  Intake    253 ml  Output    900 ml  Net   -647 ml     PHYSICAL EXAM: General: sitting up in bed. No respiratory difficulty HEENT: normal Neck: supple. JVD 5-6 . Carotids 2+ bilat; no bruits. No lymphadenopathy or thryomegaly appreciated. Cor: PMI laterally displaced. Regular rate & rhythm. No rubs, murmurs. + S3  Lungs: clear Abdomen: soft, nontender, +distended. No hepatosplenomegaly. No bruits or masses. Good bowel sounds. Extremities: no cyanosis, clubbing, rash, R and LLE 1+ edema. Ted hose in place Neuro: alert & oriented x 3, cranial  nerves grossly intact. moves all 4 extremities w/o difficulty. Affect flat but appropriate.  Telemetry: NSR  Labs: Basic Metabolic Panel:  Recent Labs Lab 07/12/14 1526  07/14/14 0445 07/15/14 0434 07/16/14 0440 07/17/14 0439 07/18/14 0525  NA 136  < > 135 134* 135 132* 137  K 4.1  < > 3.3* 3.5 3.6 3.8 4.0  CL 101  < > 94* 89* 87* 84* 92*  CO2 25  < > 32 34* 36* 36* 35*  GLUCOSE 152*  < > 93 105* 87 84 75  BUN 13  < > $R'10 10 8 7 8  'iW$ CREATININE 1.15*  < > 1.06* 1.12* 0.94 1.03* 0.90  CALCIUM 8.9  < > 8.9 9.3 10.1 9.4 8.8*  MG 1.9  --   --   --   --   --   --   < > = values in this interval not displayed.  Liver Function Tests:  Recent Labs Lab 07/12/14 1526  AST 36  ALT 24  ALKPHOS 69  BILITOT 3.0*  PROT 6.4*  ALBUMIN 3.1*   No results for input(s): LIPASE, AMYLASE in the last 168 hours. No results for input(s): AMMONIA in the last 168 hours.  CBC:  Recent Labs Lab 07/12/14 1526 07/17/14 0439  WBC 3.6* 4.6  NEUTROABS 1.7  --   HGB 13.1 14.9  HCT 40.1 45.6  MCV 73.3* 72.6*  PLT 201 223    Cardiac Enzymes:  Recent Labs Lab 07/12/14 1526 07/12/14 2230  TROPONINI <0.03 <0.03    BNP: BNP (last 3 results)  Recent Labs  07/12/14 1526  BNP 1582.4*    ProBNP (last 3 results)  Recent Labs  06/24/14 1642  PROBNP 2172.0*      Other results:  Imaging: Ct Abdomen Pelvis Wo Contrast  07/17/2014   CLINICAL DATA:  Evaluation for left ventricular assist device. History of heart failure.  EXAM: CT CHEST, ABDOMEN AND PELVIS WITHOUT CONTRAST  TECHNIQUE: Multidetector CT imaging of the chest, abdomen and pelvis was performed following the standard protocol without IV contrast.  COMPARISON:  Chest CT 02/19/2009  FINDINGS: CT CHEST FINDINGS  Right-sided PICC line present with tip in the SVC just above the cavoatrial junction. Exam demonstrates mild right apical pleural thickening unchanged. Lungs are adequately inflated without focal consolidation or  effusion. Subtle linear scarring over the lingula. 3 mm subpleural nodule over the anterior left upper lobe unchanged compared to 2011. Subtle debris along the left lateral wall of the trachea which may be due to aspiration.  There is mild to moderate cardiomegaly with a small pericardial effusion. No significant hilar, mediastinal or axillary adenopathy. Remaining bones and soft tissues within normal.  CT ABDOMEN AND PELVIS FINDINGS  The liver, spleen, pancreas, gallbladder and adrenal glands are within normal. Kidneys are normal in size without hydronephrosis or nephrolithiasis. Ureters are within normal. The appendix is normal. Abdominal aorta is within normal. Minimal diverticulosis of the colon  Pelvic images demonstrate a suggestion of a few small uterine fibroids with the largest over the fundus with partial calcification measuring approximately 2.8 cm. Ovaries are within normal. There is a small amount of free fluid in the pelvis. Remaining bones and soft tissues are within normal.  IMPRESSION: No acute findings in the chest, abdomen or pelvis.  Moderate cardiomegaly with small pericardial effusion.  Subtle debris along the left lateral wall of the trachea which may represent aspirated material.  Mild diverticulosis of the colon.  2.8 cm fundal fibroid.  Mild amount of free pelvic fluid.   Electronically Signed   By: Marin Olp M.D.   On: 07/17/2014 13:26   Dg Orthopantogram  07/17/2014   CLINICAL DATA:  Preoperative films for LVAD surgery. No jaw complaints.  EXAM: ORTHOPANTOGRAM/PANORAMIC  COMPARISON:  None.  FINDINGS: Mandible is within normal. Possible postsurgical change over the root of the most posterior left lower molar tooth. Mild opacification the region of the left maxillary sinus which may represent mild inflammatory change. Remaining bones and soft tissues are within normal.  IMPRESSION: No acute findings.   Electronically Signed   By: Marin Olp M.D.   On: 07/17/2014 13:31   Dg Chest  2 View  07/17/2014   CLINICAL DATA:  Preoperative evaluation for left ventricular assist device placement  EXAM: CHEST  2 VIEW  COMPARISON:  Chest radiograph July 13, 2014; chest CT July 17, 2014  FINDINGS: There is no edema or consolidation. There is cardiac enlargement with pulmonary vascularity within normal limits. No adenopathy. Central catheter tip is in the superior vena cava. No pneumothorax. No bone lesions.  IMPRESSION: Cardiac enlargement.  No edema or consolidation.   Electronically Signed   By: Lowella Grip III M.D.   On: 07/17/2014 13:32   Ct Chest Wo Contrast  07/17/2014   CLINICAL DATA:  Evaluation for left ventricular assist device. History of heart failure.  EXAM:  CT CHEST, ABDOMEN AND PELVIS WITHOUT CONTRAST  TECHNIQUE: Multidetector CT imaging of the chest, abdomen and pelvis was performed following the standard protocol without IV contrast.  COMPARISON:  Chest CT 02/19/2009  FINDINGS: CT CHEST FINDINGS  Right-sided PICC line present with tip in the SVC just above the cavoatrial junction. Exam demonstrates mild right apical pleural thickening unchanged. Lungs are adequately inflated without focal consolidation or effusion. Subtle linear scarring over the lingula. 3 mm subpleural nodule over the anterior left upper lobe unchanged compared to 2011. Subtle debris along the left lateral wall of the trachea which may be due to aspiration.  There is mild to moderate cardiomegaly with a small pericardial effusion. No significant hilar, mediastinal or axillary adenopathy. Remaining bones and soft tissues within normal.  CT ABDOMEN AND PELVIS FINDINGS  The liver, spleen, pancreas, gallbladder and adrenal glands are within normal. Kidneys are normal in size without hydronephrosis or nephrolithiasis. Ureters are within normal. The appendix is normal. Abdominal aorta is within normal. Minimal diverticulosis of the colon  Pelvic images demonstrate a suggestion of a few small uterine fibroids with the  largest over the fundus with partial calcification measuring approximately 2.8 cm. Ovaries are within normal. There is a small amount of free fluid in the pelvis. Remaining bones and soft tissues are within normal.  IMPRESSION: No acute findings in the chest, abdomen or pelvis.  Moderate cardiomegaly with small pericardial effusion.  Subtle debris along the left lateral wall of the trachea which may represent aspirated material.  Mild diverticulosis of the colon.  2.8 cm fundal fibroid.  Mild amount of free pelvic fluid.   Electronically Signed   By: Elberta Fortis M.D.   On: 07/17/2014 13:26    Latest Cath  [facility] 07/17/14  RA = 3 RV = 27/2/3 PA = 29/13 (21) PCW = 5 Fick cardiac output/index = 3.3/2.3 PVR = 4.8 WU SVR = 2116 FA sat = 99% PA sat = 70%, 71%  Ao Pressure: 109/67 (83) LV Pressure: 106/3/9  Assessment: 1) Normal coronary arteries 2) Severe NICM with EF 15% by echo 3) Well compensated filling pressures with moderately reduced cardiac output  Plan:  Cath looks good. Coronaries are clean. She is well diuresed. CVP is reassuring despite significant RV dysfunction on echo. Continue with VAD work-up.   Bensimhon, Daniel,MD 2:10 PM 07/17/14   Medications:     Scheduled Medications: . calcium-vitamin D  1 tablet Oral Daily  . digoxin  0.125 mg Oral Daily  . enoxaparin (LOVENOX) injection  40 mg Subcutaneous Q24H  . feeding supplement (ENSURE ENLIVE)  237 mL Oral BID BM  . ferrous sulfate  325 mg Oral Q breakfast  . furosemide  40 mg Oral BID  . potassium chloride SA  20 mEq Oral BID  . sacubitril-valsartan  1 tablet Oral BID  . sodium chloride  3 mL Intravenous Q12H  . sodium chloride  3 mL Intravenous Q12H  . spironolactone  25 mg Oral Daily    Infusions:    PRN Medications: sodium chloride, sodium chloride, acetaminophen, acetaminophen, ondansetron (ZOFRAN) IV, sodium chloride, sodium chloride, sodium chloride, traMADol   Assessment:   1. Acute on  chronic systolic heart failure    --presumed NICM     --Echo 07/13/14. EF 20% RV moderately dilated.     --cMRI 3/10 EF 20%. Echo 22014 EF 30-35%    --refused cath in past 2. Cardiogenic shock 3. Microcytic anemia 4. Hypokalemia 5. Leg pain   Plan/Discussion:  CO-OX 71%. Continue oral lasix. No bb with low output. Cont spiro 25 mg and entresto. Creatinine up slightly from baseline 1.03 -> 0.94 -> 1.12.    Iron stores - OK.   SPEP/UPEP pending.   Cardiac rehab following.  Pt well educated about advanced therapies 07/16/14 including home inotropes, LVAD, and possible transplant.  VAD team and Dr. Prescott Gum seeing as well.  Given pt size considering Heartware vs HeartmateII.  Had reassuring L/RHC for VAD yesterday, results as above. CT obtained 07/17/14  Cardiac MRI pending.  No acute findings on CT C/A/P.   To get PFTs prior to discharge. Will need outpatient colonoscopy per VAD team as well.  Will discuss timing and disposition with MD.    Length of Stay: 6 Shirley Friar PA-C 07/18/2014, 7:28 AM Advanced Heart Failure Team Pager [phone] (M-F; 7a - 4p)  Please contact [facility] for night-coverage after hours (4p -7a ) and weekends on [email or web address]   Patient seen and examined with Oda Kilts, PA-C. We discussed all aspects of the encounter. I agree with the assessment and plan as stated above.   Clinically improved. Numbers look much better. cMRI reviewed which shows severe biventricular dysfunction. No infiltrative process. Can likely go home tomorrow with close f/u. Ideally would like her to have Tx work-up but may not have time to wait. With degree of RV dysfunction I would prefer not to wait too much longer if we are going to move with VAD. Will need CPX as outpatient. Check blood type.   Bensimhon, Daniel,MD 7:03 PM

## 2014-07-19 ENCOUNTER — Inpatient Hospital Stay (HOSPITAL_COMMUNITY): Payer: BC Managed Care – PPO

## 2014-07-19 LAB — BASIC METABOLIC PANEL
ANION GAP: 9 (ref 5–15)
BUN: 6 mg/dL (ref 6–20)
CHLORIDE: 97 mmol/L — AB (ref 101–111)
CO2: 31 mmol/L (ref 22–32)
Calcium: 9.1 mg/dL (ref 8.9–10.3)
Creatinine, Ser: 0.74 mg/dL (ref 0.44–1.00)
GFR calc non Af Amer: >60 mL/min (ref >60)
GLUCOSE: 96 mg/dL (ref 65–99)
Potassium: 4 mmol/L (ref 3.5–5.1)
SODIUM: 137 mmol/L (ref 135–145)

## 2014-07-19 LAB — PULMONARY FUNCTION TEST
DL/VA % pred: 89 %
DL/VA: 4.05 ml/min/mmHg/L
DLCO UNC % PRED: 41 %
DLCO UNC: 9.05 ml/min/mmHg
DLCO cor % pred: 40 %
DLCO cor: 8.67 ml/min/mmHg
FEF 25-75 POST: 0.22 L/s
FEF 25-75 Pre: 1.18 L/sec
FEF2575-%Change-Post: -81 %
FEF2575-%Pred-Post: 10 %
FEF2575-%Pred-Pre: 56 %
FEV1-%CHANGE-POST: -12 %
FEV1-%Pred-Post: 53 %
FEV1-%Pred-Pre: 61 %
FEV1-PRE: 1.24 L
FEV1-Post: 1.09 L
FEV1FVC-%Change-Post: 8 %
FEV1FVC-%PRED-PRE: 101 %
FEV6-%Change-Post: -19 %
FEV6-%PRED-POST: 50 %
FEV6-%Pred-Pre: 62 %
FEV6-POST: 1.24 L
FEV6-Pre: 1.53 L
FEV6FVC-%PRED-POST: 104 %
FEV6FVC-%PRED-PRE: 104 %
FVC-%CHANGE-POST: -19 %
FVC-%PRED-PRE: 60 %
FVC-%Pred-Post: 48 %
FVC-Post: 1.24 L
FVC-Pre: 1.53 L
Post FEV1/FVC ratio: 88 %
Post FEV6/FVC ratio: 100 %
Pre FEV1/FVC ratio: 81 %
Pre FEV6/FVC Ratio: 100 %
RV % PRED: 101 %
RV: 1.83 L
TLC % PRED: 67 %
TLC: 3.22 L

## 2014-07-19 LAB — UIFE/LIGHT CHAINS/TP QN, 24-HR UR
% BETA, Urine: UNDETERMINED %
ALPHA 1 URINE: UNDETERMINED %
ALPHA 2 UR: UNDETERMINED %
Albumin, U: UNDETERMINED %
FREE LAMBDA LT CHAINS, UR: 0.83 mg/L (ref 0.24–6.66)
Free Kappa/Lambda Ratio: 1.52 — ABNORMAL LOW (ref 2.04–10.37)
Free Lt Chn Excr Rate: 1.26 mg/L — ABNORMAL LOW (ref 1.35–24.19)
GAMMA GLOBULIN URINE: UNDETERMINED %
IMMUNOFIXATION RESULT, URINE: UNDETERMINED
M-SPIKE %, URINE: UNDETERMINED %
TOTAL PROTEIN, URINE-UPE24: 4.5 mg/dL

## 2014-07-19 LAB — FACTOR 5 LEIDEN

## 2014-07-19 MED ORDER — SACUBITRIL-VALSARTAN 49-51 MG PO TABS: 1.0000 | ORAL_TABLET | Freq: Two times a day (BID) | ORAL | Status: DC

## 2014-07-19 MED ORDER — POTASSIUM CHLORIDE CRYS ER 20 MEQ PO TBCR: 20.0000 meq | EXTENDED_RELEASE_TABLET | Freq: Two times a day (BID) | ORAL | Status: DC

## 2014-07-19 MED ORDER — ACETAMINOPHEN 325 MG PO TABS: 650.0000 mg | ORAL_TABLET | Freq: Four times a day (QID) | ORAL | Status: AC | PRN

## 2014-07-19 MED ORDER — ALBUTEROL SULFATE (2.5 MG/3ML) 0.083% IN NEBU
2.5000 mg | INHALATION_SOLUTION | Freq: Once | RESPIRATORY_TRACT | Status: AC
  Administered 2014-07-19: 2.5 mg via RESPIRATORY_TRACT

## 2014-07-19 MED ORDER — SPIRONOLACTONE 25 MG PO TABS: 25.0000 mg | ORAL_TABLET | Freq: Every day | ORAL | Status: DC

## 2014-07-19 NOTE — Progress Notes (Signed)
Advanced Heart Failure Rounding Note   Subjective:    54 y/o with h/o HTN, chronic systolic HF due to presumed NICM.  Admitted from [facility] on 6/10 with decompensated HF. (Had not been seen in Clinic ~ 2 years)  Milrinone started on 6/11 due to sluggish diuresis and co-ox 44%. Delene Loll and Spiro started 07/15/14 CO-OX  47% -> 69% -> 59% -> 52.7% -> 70% 07/17/14  Brisk diuresis noted. -5 liters overall, No output recorded 07/18/14 but down 30 lbs from admit.  Has met with multiple members of the VAD team including a VAD patient.  Had reassuring [facility] 07/17/14 pertaining to VAD candidacy. cMRI 07/18/14, CTs 07/17/14. To obtain PFTs prior to d/c.  Feeling good today.  States she has been peeing in the toilet and no one has measured them.  I asked if she'd had the PFTs done and says she doesn't think so. She wonders if she will go home today.   Objective:   Weight Range:  Vital Signs:   Temp:  [97.4 F (36.3 C)-98.5 F (36.9 C)] 97.4 F (36.3 C) (06/17 0500) Pulse Rate:  [98-122] 98 (06/17 0500) Resp:  [16-22] 16 (06/17 0500) BP: (103-116)/(63-94) 103/78 mmHg (06/17 0500) SpO2:  [98 %-100 %] 100 % (06/17 0500) Weight:  [97 lb 11.2 oz (44.316 kg)] 97 lb 11.2 oz (44.316 kg) (06/17 0500) Last BM Date: 07/17/14  Weight change: Filed Weights   07/16/14 1234 07/17/14 0514 07/19/14 0500  Weight: 112 lb 12.8 oz (51.166 kg) 109 lb 1.6 oz (49.487 kg) 97 lb 11.2 oz (44.316 kg)    Intake/Output:   Intake/Output Summary (Last 24 hours) at 07/19/14 0919 Last data filed at 07/19/14 0200  Gross per 24 hour  Intake    600 ml  Output      0 ml  Net    600 ml     PHYSICAL EXAM: General: sitting up in bed. No respiratory difficulty HEENT: normal Neck: supple. JVD not appreciated. Carotids 2+ bilat; no bruits. No lymphadenopathy or thryomegaly appreciated. Cor: PMI laterally displaced. Regular rate & rhythm. No rubs, murmurs. + S3  Lungs: clear Abdomen: soft, nontender, +distended. No  hepatosplenomegaly. No bruits or masses. Good bowel sounds. Extremities: no cyanosis, clubbing, rash, R and LLE 1+ edema. Ted hose in place Neuro: alert & oriented x 3, cranial nerves grossly intact. moves all 4 extremities w/o difficulty. Affect flat but appropriate.  Telemetry: NSR  Labs: Basic Metabolic Panel:  Recent Labs Lab 07/12/14 1526  07/15/14 0434 07/16/14 0440 07/17/14 0439 07/18/14 0525 07/19/14 0522  NA 136  < > 134* 135 132* 137 137  K 4.1  < > 3.5 3.6 3.8 4.0 4.0  CL 101  < > 89* 87* 84* 92* 97*  CO2 25  < > 34* 36* 36* 35* 31  GLUCOSE 152*  < > 105* 87 84 75 96  BUN 13  < > $R'10 8 7 8 6  'Bz$ CREATININE 1.15*  < > 1.12* 0.94 1.03* 0.90 0.74  CALCIUM 8.9  < > 9.3 10.1 9.4 8.8* 9.1  MG 1.9  --   --   --   --   --   --   < > = values in this interval not displayed.  Liver Function Tests:  Recent Labs Lab 07/12/14 1526  AST 36  ALT 24  ALKPHOS 69  BILITOT 3.0*  PROT 6.4*  ALBUMIN 3.1*   No results for input(s): LIPASE, AMYLASE in the last 168 hours. No  results for input(s): AMMONIA in the last 168 hours.  CBC:  Recent Labs Lab 07/12/14 1526 07/17/14 0439  WBC 3.6* 4.6  NEUTROABS 1.7  --   HGB 13.1 14.9  HCT 40.1 45.6  MCV 73.3* 72.6*  PLT 201 223    Cardiac Enzymes:  Recent Labs Lab 07/12/14 1526 07/12/14 2230  TROPONINI <0.03 <0.03    BNP: BNP (last 3 results)  Recent Labs  07/12/14 1526  BNP 1582.4*    ProBNP (last 3 results)  Recent Labs  06/24/14 1642  PROBNP 2172.0*      Other results:  Imaging: Ct Abdomen Pelvis Wo Contrast  07/17/2014   CLINICAL DATA:  Evaluation for left ventricular assist device. History of heart failure.  EXAM: CT CHEST, ABDOMEN AND PELVIS WITHOUT CONTRAST  TECHNIQUE: Multidetector CT imaging of the chest, abdomen and pelvis was performed following the standard protocol without IV contrast.  COMPARISON:  Chest CT 02/19/2009  FINDINGS: CT CHEST FINDINGS  Right-sided PICC line present with tip in  the SVC just above the cavoatrial junction. Exam demonstrates mild right apical pleural thickening unchanged. Lungs are adequately inflated without focal consolidation or effusion. Subtle linear scarring over the lingula. 3 mm subpleural nodule over the anterior left upper lobe unchanged compared to 2011. Subtle debris along the left lateral wall of the trachea which may be due to aspiration.  There is mild to moderate cardiomegaly with a small pericardial effusion. No significant hilar, mediastinal or axillary adenopathy. Remaining bones and soft tissues within normal.  CT ABDOMEN AND PELVIS FINDINGS  The liver, spleen, pancreas, gallbladder and adrenal glands are within normal. Kidneys are normal in size without hydronephrosis or nephrolithiasis. Ureters are within normal. The appendix is normal. Abdominal aorta is within normal. Minimal diverticulosis of the colon  Pelvic images demonstrate a suggestion of a few small uterine fibroids with the largest over the fundus with partial calcification measuring approximately 2.8 cm. Ovaries are within normal. There is a small amount of free fluid in the pelvis. Remaining bones and soft tissues are within normal.  IMPRESSION: No acute findings in the chest, abdomen or pelvis.  Moderate cardiomegaly with small pericardial effusion.  Subtle debris along the left lateral wall of the trachea which may represent aspirated material.  Mild diverticulosis of the colon.  2.8 cm fundal fibroid.  Mild amount of free pelvic fluid.   Electronically Signed   By: Marin Olp M.D.   On: 07/17/2014 13:26   Dg Orthopantogram  07/17/2014   CLINICAL DATA:  Preoperative films for LVAD surgery. No jaw complaints.  EXAM: ORTHOPANTOGRAM/PANORAMIC  COMPARISON:  None.  FINDINGS: Mandible is within normal. Possible postsurgical change over the root of the most posterior left lower molar tooth. Mild opacification the region of the left maxillary sinus which may represent mild inflammatory  change. Remaining bones and soft tissues are within normal.  IMPRESSION: No acute findings.   Electronically Signed   By: Marin Olp M.D.   On: 07/17/2014 13:31   Dg Chest 2 View  07/17/2014   CLINICAL DATA:  Preoperative evaluation for left ventricular assist device placement  EXAM: CHEST  2 VIEW  COMPARISON:  Chest radiograph July 13, 2014; chest CT July 17, 2014  FINDINGS: There is no edema or consolidation. There is cardiac enlargement with pulmonary vascularity within normal limits. No adenopathy. Central catheter tip is in the superior vena cava. No pneumothorax. No bone lesions.  IMPRESSION: Cardiac enlargement.  No edema or consolidation.   Electronically Signed  By: Lowella Grip III M.D.   On: 07/17/2014 13:32   Ct Chest Wo Contrast  07/17/2014   CLINICAL DATA:  Evaluation for left ventricular assist device. History of heart failure.  EXAM: CT CHEST, ABDOMEN AND PELVIS WITHOUT CONTRAST  TECHNIQUE: Multidetector CT imaging of the chest, abdomen and pelvis was performed following the standard protocol without IV contrast.  COMPARISON:  Chest CT 02/19/2009  FINDINGS: CT CHEST FINDINGS  Right-sided PICC line present with tip in the SVC just above the cavoatrial junction. Exam demonstrates mild right apical pleural thickening unchanged. Lungs are adequately inflated without focal consolidation or effusion. Subtle linear scarring over the lingula. 3 mm subpleural nodule over the anterior left upper lobe unchanged compared to 2011. Subtle debris along the left lateral wall of the trachea which may be due to aspiration.  There is mild to moderate cardiomegaly with a small pericardial effusion. No significant hilar, mediastinal or axillary adenopathy. Remaining bones and soft tissues within normal.  CT ABDOMEN AND PELVIS FINDINGS  The liver, spleen, pancreas, gallbladder and adrenal glands are within normal. Kidneys are normal in size without hydronephrosis or nephrolithiasis. Ureters are within  normal. The appendix is normal. Abdominal aorta is within normal. Minimal diverticulosis of the colon  Pelvic images demonstrate a suggestion of a few small uterine fibroids with the largest over the fundus with partial calcification measuring approximately 2.8 cm. Ovaries are within normal. There is a small amount of free fluid in the pelvis. Remaining bones and soft tissues are within normal.  IMPRESSION: No acute findings in the chest, abdomen or pelvis.  Moderate cardiomegaly with small pericardial effusion.  Subtle debris along the left lateral wall of the trachea which may represent aspirated material.  Mild diverticulosis of the colon.  2.8 cm fundal fibroid.  Mild amount of free pelvic fluid.   Electronically Signed   By: Marin Olp M.D.   On: 07/17/2014 13:26   Mr Card Morphology Wo/w Cm  07/18/2014   CLINICAL DATA:  54 year old female with non-ischemic cardiomyopathy.  EXAM: CARDIAC MRI  TECHNIQUE: The patient was scanned on a 1.5 Tesla GE magnet. A dedicated cardiac coil was used. Functional imaging was done using Fiesta sequences. 2,3, and 4 chamber views were done to assess for RWMA's. Modified Simpson's rule using a short axis stack was used to calculate an ejection fraction on a dedicated work Conservation officer, nature. The patient received 15 cc of Multihance. After 10 minutes inversion recovery sequences were used to assess for infiltration and scar tissue.  CONTRAST:  15 cc  of Multihance  FINDINGS:MILD: FINDINGS:MILD 1. Moderately dilated left ventricle with normal wall thickness and severely decreased systolic function (LVEF = 17%).  There is severe global hypokinesis more pronounced in the apical and mid segments. Paradoxical septal motion.  LVEDD:  61 mm  LVESD:  54 mm  LVEDV:  131 ml  LVESV:  108 ml  SV:  23 ml  2. Normal right ventricular size, thickness with severely impaired systolic function (RVEF = 19%).  There are no regional wall motion abnormalities.  RVEDV:  118 ml  RVESV:   95 ml  SV:  23 ml  3.  There is mild left and moderate right atrial dilatation.  4.  Mild mitral and moderate tricuspid regurgitation.  5. There is diffuse midwall late gadolinium enhancement in the basal anteroseptal, mid anteroseptal, inferoseptal, inferior, inferolateral, anterolateral and apical anterior and septal walls.  6. Normal size of the aortic root, ascending, descending  thoracic aorta and pulmonary artery.  IMPRESSION: 1. Moderately dilated left ventricle with normal wall thickness and severely decreased systolic function (LVEF = 17%). There is severe global hypokinesis more pronounced in the apical and mid segments. Paradoxical septal motion.  There is diffuse midwall late gadolinium enhancement consistent with dilated cardiomyopathy. No evidence of infiltrative cardiomyopathy was identified.  2. Normal right ventricular size, thickness with severely impaired systolic function (RVEF = 19%) with no regional wall motion abnormalities.  3.  There is mild left and moderate right atrial dilatation.  4.  Mild mitral and moderate tricuspid regurgitation.  Diana Bird   Electronically Signed   By: Diana Bird   On: 07/18/2014 15:57    Latest Cath  R/LHC 07/17/14  RA = 3 RV = 27/2/3 PA = 29/13 (21) PCW = 5 Fick cardiac output/index = 3.3/2.3 PVR = 4.8 WU SVR = 2116 FA sat = 99% PA sat = 70%, 71%  Ao Pressure: 109/67 (83) LV Pressure: 106/3/9  Assessment: 1) Normal coronary arteries 2) Severe NICM with EF 15% by echo 3) Well compensated filling pressures with moderately reduced cardiac output  Plan:  Cath looks good. Coronaries are clean. She is well diuresed. CVP is reassuring despite significant RV dysfunction on echo. Continue with VAD work-up.     Medications:     Scheduled Medications: . calcium-vitamin D  1 tablet Oral Daily  . digoxin  0.125 mg Oral Daily  . enoxaparin (LOVENOX) injection  40 mg Subcutaneous Q24H  . feeding supplement (ENSURE ENLIVE)  237 mL  Oral BID BM  . ferrous sulfate  325 mg Oral Q breakfast  . furosemide  40 mg Oral BID  . potassium chloride SA  20 mEq Oral BID  . sacubitril-valsartan  1 tablet Oral BID  . sodium chloride  3 mL Intravenous Q12H  . sodium chloride  3 mL Intravenous Q12H  . spironolactone  25 mg Oral Daily    Infusions:    PRN Medications: sodium chloride, sodium chloride, acetaminophen, acetaminophen, ondansetron (ZOFRAN) IV, sodium chloride, sodium chloride, sodium chloride, traMADol   Assessment:   1. Acute on chronic systolic heart failure    --d/t NICM     --Echo 07/13/14. EF 20% RV moderately dilated. EF 17% by cMRI 2. Cardiogenic shock 3. Microcytic anemia 4. Hypokalemia 5. Leg pain   Plan/Discussion:    CO-OX 71%. Continue oral lasix. No bb with low output. Cont spiro 25 mg and entresto. Creatinine stable 0.74  Iron stores - OK.   SPEP/UPEP pending.   Cardiac rehab following.  Pt well educated about advanced therapies 07/16/14 including home inotropes, LVAD, and possible transplant.  VAD team and Dr. Prescott Gum seeing as well.  Given pt size considering Heartware vs Heartmate II.  Had reassuring L/RHC for VAD yesterday, results as above.   cMRI reviewed shows severe biventricular dysfunction, No infiltrative processpending.  No acute findings on CT C/A/P.   PFTs ordered 07/16/14, but not done yet.  Will find out why, as would like done prior to discharge. Will need outpatient colonoscopy per VAD team as well. Will need CPX as outpatient. Blood type O+.  Per Dr Haroldine Laws with degree of RV dysfunction would prefer not to wait too much longer if we are going to move with VAD.  Likely home today after PFTs, if can be done today.   Length of Stay: 7 Shirley Friar PA-C 07/19/2014, 9:19 AM Advanced Heart Failure Team Pager [phone] (M-F; 7a - 4p)  Please  contact [facility] for night-coverage after hours (4p -7a ) and weekends on [email or web address]   Patient seen and examined  with Oda Kilts, PA-C. We discussed all aspects of the encounter. I agree with the assessment and plan as stated above.   Doing well. Co-ox improved. RHC better than i expected. Blood type O.  Can go home today. Will need rapid evaluation with CPX and transplant evaluation. Suspect she will need bridge VAD. Given degree of RV dysfunction would not wait too much longer to move forward.   D/c meds: Entresto 49/51 bid Digoxin 0.125 Spiro 25 daily Lasix 40 bid Kcl 20 bid  Bensimhon, Daniel,MD 11:02 AM

## 2014-07-19 NOTE — Progress Notes (Signed)
DC IV, DC Tele, DC Home. Discharge instructions and home medications discussed with patient. Patient denied any questions or concerns at this time. Patient leaving unit via wheelchair and appears in no acute distress.  

## 2014-07-19 NOTE — Progress Notes (Signed)
Coupon card given to patient for a free 30 day supply of Entresto; Card Activated. Medication to be picked up at [facility]. Mindi Slicker RN,BSN,MHA [phone]

## 2014-07-19 NOTE — Discharge Summary (Signed)
Advanced Heart Failure Team  Discharge Summary   Patient ID: Diana Bird MRN: [id number], DOB/AGE: Dec 26, 1960 54 y.o. Admit date: 07/12/2014 D/C date:     07/19/2014   Primary Discharge Diagnoses:  1. Acute on chronic systolic heart failure  --d/t NICM   --Echo 07/13/14. EF 20% RV moderately dilated. EF 17% by cMRI 2. Cardiogenic shock 3. Microcytic anemia 4. Hypokalemia 5. Leg pain    Hospital Course:   Diana Bird was admitted from the HF clinic on 6/10 with decompensated HF. Prior to this she had not been seen in the Clinic in approx 2 years.  She was started on milrinone 6/11 due to low output HF with sluggish diuresis and co-ox 44%. Her diuresis picked up with milrinone. Her HF meds were adjusted. Her co-ox gradually improved and was measured at 70% on 07/17/14 after milrinone wean.  Echocardiogram showed severe LV dysfunction with EF 10-15% and moderate RV dysfunction.   Had a long discussion about advanced therapies on 07/16/14 including home inotropes, LVAD, and possible transplant.  Pt was seen by VAD team including Dr. Prescott Gum and thought to be a candidate for LVAD as bridging therapy.  Likely Heartware as opposed to Heartmate II due to patient size. R and L heart catherization was performed on 07/17/14 by Dr Haroldine Laws and she was noted to have Normal coronaries, Severe NICM with EF 15% by echo, and well compensated filling pressures with moderately reduced CO.  She had a large portion of her VAD screening tests in the hospital including PFTs, CT C/A/P, and cMRI which were all re-assuring for her candicacy.  She will need an outpatient colonoscopy, mammogram, dental evaluation, as well as pre-VAD Dopplers. She will also need to be evaluated for [facility] at [facility]  She is aware that the journey up to a VAD is a difficult process, and has been counseled on the importance of a "teammate".  She has mentioned the possibility of this being one of her children or  her sister.  She has also been counseled on the importance of this team in managing a VAD post-placement, and what types of difficulties to expect adjusting to this.   Overall she diuresed well with 30 lbs of weight loss.  Her output was not monitored once patient starting using the bathroom on her own the last 2 days of admission, so I/Os are inacurate. She will be discharged to home without difficulty. She knows to call if she has any worsening symptoms including worsening SOB/DOE, weight gain of >3lbs in one day, or >5lbs in 7 days. She will follow up in the HF clinic on 07/25/14 as below.  Discharge Weight: 97 lbs 11 oz Discharge Vitals: Blood pressure 95/68, pulse 102, temperature 98.3 F (36.8 C), temperature source Oral, resp. rate 20, height 5\' 2"  (1.575 m), weight 97 lb 11.2 oz (44.316 kg), SpO2 99 %.  Labs: Lab Results  Component Value Date   WBC 4.6 07/17/2014   HGB 14.9 07/17/2014   HCT 45.6 07/17/2014   MCV 72.6* 07/17/2014   PLT 223 07/17/2014    Recent Labs Lab 07/12/14 1526  07/19/14 0522  NA 136  < > 137  K 4.1  < > 4.0  CL 101  < > 97*  CO2 25  < > 31  BUN 13  < > 6  CREATININE 1.15*  < > 0.74  CALCIUM 8.9  < > 9.1  PROT 6.4*  --   --   BILITOT 3.0*  --   --  ALKPHOS 69  --   --   ALT 24  --   --   AST 36  --   --   GLUCOSE 152*  < > 96  < > = values in this interval not displayed. Lab Results  Component Value Date   CHOL 94 07/17/2014   HDL 27* 07/17/2014   LDLCALC 40 07/17/2014   TRIG 137 07/17/2014   BNP (last 3 results)  Recent Labs  07/12/14 1526  BNP 1582.4*    ProBNP (last 3 results)  Recent Labs  06/24/14 1642  PROBNP 2172.0*     Diagnostic Studies/Procedures   Mr Card Morphology Wo/w Cm  07/18/2014   CLINICAL DATA:  53 year old female with non-ischemic cardiomyopathy.  EXAM: CARDIAC MRI  TECHNIQUE: The patient was scanned on a 1.5 Tesla GE magnet. A dedicated cardiac coil was used. Functional imaging was done using Fiesta  sequences. 2,3, and 4 chamber views were done to assess for RWMA's. Modified Simpson's rule using a short axis stack was used to calculate an ejection fraction on a dedicated work Conservation officer, nature. The patient received 15 cc of Multihance. After 10 minutes inversion recovery sequences were used to assess for infiltration and scar tissue.  CONTRAST:  15 cc  of Multihance  FINDINGS:MILD: FINDINGS:MILD 1. Moderately dilated left ventricle with normal wall thickness and severely decreased systolic function (LVEF = 17%).  There is severe global hypokinesis more pronounced in the apical and mid segments. Paradoxical septal motion.  LVEDD:  61 mm  LVESD:  54 mm  LVEDV:  131 ml  LVESV:  108 ml  SV:  23 ml  2. Normal right ventricular size, thickness with severely impaired systolic function (RVEF = 19%).  There are no regional wall motion abnormalities.  RVEDV:  118 ml  RVESV:  95 ml  SV:  23 ml  3.  There is mild left and moderate right atrial dilatation.  4.  Mild mitral and moderate tricuspid regurgitation.  5. There is diffuse midwall late gadolinium enhancement in the basal anteroseptal, mid anteroseptal, inferoseptal, inferior, inferolateral, anterolateral and apical anterior and septal walls.  6. Normal size of the aortic root, ascending, descending thoracic aorta and pulmonary artery.  IMPRESSION: 1. Moderately dilated left ventricle with normal wall thickness and severely decreased systolic function (LVEF = 17%). There is severe global hypokinesis more pronounced in the apical and mid segments. Paradoxical septal motion.  There is diffuse midwall late gadolinium enhancement consistent with dilated cardiomyopathy. No evidence of infiltrative cardiomyopathy was identified.  2. Normal right ventricular size, thickness with severely impaired systolic function (RVEF = 19%) with no regional wall motion abnormalities.  3.  There is mild left and moderate right atrial dilatation.  4.  Mild mitral and moderate  tricuspid regurgitation.  Ena Dawley   Electronically Signed   By: Ena Dawley   On: 07/18/2014 15:57    Discharge Medications     Medication List    STOP taking these medications        lisinopril 10 MG tablet  Commonly known as:  PRINIVIL,ZESTRIL      TAKE these medications        acetaminophen 325 MG tablet  Commonly known as:  TYLENOL  Take 2 tablets (650 mg total) by mouth every 6 (six) hours as needed for mild pain or headache.     Calcium-Vitamin D 600-400 MG-UNIT Tabs  Take 1 tablet by mouth daily.     carvedilol 6.25 MG tablet  Commonly known as:  COREG  Take 1.5 tablets (9.375 mg total) by mouth 2 (two) times daily with a meal.     digoxin 0.125 MG tablet  Commonly known as:  LANOXIN  Take 0.125 mg by mouth daily.     ferrous sulfate 325 (65 FE) MG tablet  Take 325 mg by mouth daily with breakfast.     furosemide 40 MG tablet  Commonly known as:  LASIX  Take 2 tablets (80 mg total) by mouth 2 (two) times daily.     potassium chloride SA 20 MEQ tablet  Commonly known as:  K-DUR,KLOR-CON  Take 1 tablet (20 mEq total) by mouth 2 (two) times daily.     sacubitril-valsartan 49-51 MG  Commonly known as:  ENTRESTO  Take 1 tablet by mouth 2 (two) times daily.     spironolactone 25 MG tablet  Commonly known as:  ALDACTONE  Take 1 tablet (25 mg total) by mouth daily.        Disposition   The patient will be discharged in stable condition to home. Discharge Instructions    (HEART FAILURE PATIENTS) Call MD:  Anytime you have any of the following symptoms: 1) 3 pound weight gain in 24 hours or 5 pounds in 1 week 2) shortness of breath, with or without a dry hacking cough 3) swelling in the hands, feet or stomach 4) if you have to sleep on extra pillows at night in order to breathe.    Complete by:  As directed      ACE Inhibitor / ARB already ordered    Complete by:  As directed      Diet - low sodium heart healthy    Complete by:  As directed       Heart Failure patients record your daily weight using the same scale at the same time of day    Complete by:  As directed      Increase activity slowly    Complete by:  As directed           Follow-up Information    Follow up with Glori Bickers, MD. Go on 07/25/2014.   Specialty:  Cardiology   Why:  at 10:30 am to the the Advanced Heart Failure Clinic--gate code 0800--please bring all medications to appt   Contact information:   [place] [address] [phone]         Duration of Discharge Encounter: Greater than 35 minutes   Signed, Annamaria Helling 07/19/2014, 2:55 PM   Patient seen and examined with Oda Kilts, PA-C. We discussed all aspects of the encounter. I agree with the assessment and plan as stated above.    She is stable for d/c. Much improved after diuresis. Will need advanced therapies - Ideally transplant. Will see back in HF Clinic. Will get outpatient CPX and refer to [facility] for initial transplant eval.  Beila Purdie,MD 4:13 PM

## 2014-07-19 NOTE — Progress Notes (Signed)
Checked in with Ms. Coomes today and she appears well but flat affect. Says she does not feel like company today - I told her I respect that and asked if there is anything I could do for her today. She is asking for food - diet ordered and RN says lunch should be arriving anytime now. Support offered but will respect her space.   Vinie Sill, NP Palliative Medicine Team Pager # [phone] (M-F 8a-5p) Team Phone # [phone] (Nights/Weekends)

## 2014-07-22 ENCOUNTER — Encounter (HOSPITAL_COMMUNITY): Payer: Self-pay | Admitting: Infectious Diseases

## 2014-07-22 NOTE — Progress Notes (Unsigned)
Call received by Ms. Brunell re: work not for this week. She stated that she is still not feeling well enough to report to work and would prefer for Dr. Haroldine Laws to evaluate her at her clinic appointment this week prior to RTW. Letter sent to her at her home address as requested.   She would also like to discuss VAD therapy at her next clinic appointment on 07/25/14. I assured her that either myself or Cloyde Reams would be available to discuss options with her and that we should d/w Dr. Haroldine Laws at the clinic visit as well. She will need to have a CT Colonography and mammogram done to complete her LVAD work up.

## 2014-07-24 LAB — LUPUS ANTICOAGULANT PANEL
DRVVT: 26.5 s (ref 0.0–55.1)
PTT Lupus Anticoagulant: 39.4 s (ref 0.0–50.0)

## 2014-07-25 ENCOUNTER — Ambulatory Visit (HOSPITAL_COMMUNITY)
Admission: RE | Admit: 2014-07-25 | Discharge: 2014-07-25 | Disposition: A | Discharge status: Home / Self Care | Payer: BC Managed Care – PPO | Source: Ambulatory Visit | Attending: Cardiology | Admitting: Cardiology

## 2014-07-25 ENCOUNTER — Encounter (HOSPITAL_COMMUNITY): Payer: Self-pay

## 2014-07-25 VITALS — BP 96/58 | HR 84 | Wt 107.8 lb

## 2014-07-25 DIAGNOSIS — I10 Essential (primary) hypertension: Secondary | ICD-10-CM | POA: Insufficient documentation

## 2014-07-25 DIAGNOSIS — I420 Dilated cardiomyopathy: Secondary | ICD-10-CM | POA: Insufficient documentation

## 2014-07-25 DIAGNOSIS — Z79899 Other long term (current) drug therapy: Secondary | ICD-10-CM | POA: Insufficient documentation

## 2014-07-25 DIAGNOSIS — I5022 Chronic systolic (congestive) heart failure: Secondary | ICD-10-CM | POA: Insufficient documentation

## 2014-07-25 DIAGNOSIS — Z6740 Type O blood, Rh positive: Secondary | ICD-10-CM | POA: Insufficient documentation

## 2014-07-25 LAB — BRAIN NATRIURETIC PEPTIDE: B Natriuretic Peptide: 1686 pg/mL — ABNORMAL HIGH (ref 0.0–100.0)

## 2014-07-25 LAB — BASIC METABOLIC PANEL
Anion gap: 5 (ref 5–15)
BUN: 6 mg/dL (ref 6–20)
CO2: 29 mmol/L (ref 22–32)
Calcium: 9.6 mg/dL (ref 8.9–10.3)
Chloride: 104 mmol/L (ref 101–111)
Creatinine, Ser: 0.91 mg/dL (ref 0.44–1.00)
GFR calc Af Amer: >60 mL/min (ref >60)
Glucose, Bld: 104 mg/dL — ABNORMAL HIGH (ref 65–99)
POTASSIUM: 4.2 mmol/L (ref 3.5–5.1)
Sodium: 138 mmol/L (ref 135–145)

## 2014-07-25 NOTE — Progress Notes (Signed)
Met with Diana Bird again today in clinic visit after having been home from the hospital a week now. She has had a lot of time to reflect on the LVAD/Transplant options presented to her.  D/W her and Dr. Haroldine Laws today regarding the process from here: CPX testing arranged for next week, Dr. Haroldine Laws will consult with [facility] for full heart transplant evaluation as she has a very uncomplicated history that is limited to advanced heart failure and would be a good transplant candidate based on her ongoing evaluation at this time. I informed her that I would be seeking prior approval for the CT Colonography (vs. Conventional screening colonoscopy d/t her high risk with the LVEF 15%) and mammogram. Her Oropantogram was normal, however she will need to follow up and obtain clearance from a dentist for either procedure, of which she will assume responsibility for.   I showed her the HVAD vs the HMII and educated her regarding the differences in pump operation, wearables, and therapy intent of the two; being that she is likely a BTT candidate HVAD seems to the be the choice for her--she certainly liked the smaller pump better than the HMII. She asked good and appropriate questions and maintained good interaction throughout the encounter.   Only reservation I have for her to be successful at this time is lack of an identifiable caregiver(s)--she assures me that she is thinkning about it, however wants to take things "one at a time" and is not prioritized that yet. I told her that she should not underestimate the amount of time she has to make a decision and that if her body was to deteriorate faster this process would escalate significantly and if she does not have the social/family support it has been a limiting factor for therapy in the past. She verbalized that she understood and would continue to think about who her caregivers would be.

## 2014-07-25 NOTE — Patient Instructions (Signed)
LABS today (bmet bnp)   Your provider requests you have a Cardiopulmonary Exercise Test.   Your provider has referred you to [facility] for a Transplant Evaluation.

## 2014-07-25 NOTE — Progress Notes (Signed)
Patient ID: Diana Bird, female   DOB: 03-Nov-1960, 54 y.o.   MRN: [id number] PCP: Pierre Bali MD Primary Cardiologist: Rosalita Chessman  HPI: Diana Bird  Is a 54 y/o woman with h/o systolic HF due to NICM dating back to 2007. She was admitted from the HF clinic on 07/12/14 with decompensated HF. Prior to this she had not been seen in the Clinic in approx 2 years. She was started on milrinone 6/11 due to sluggish diuresis and co-ox 44%. She was started on entresto and spiro on 07/15/14. Her co-ox gradually improved and was measured at 70% on 07/17/14 after milrinone wean.   During that admission, R and L heart catherization was performed on 07/17/14 and she was noted to have Normal coronaries with evere NICM with EF 15% by echo, and well compensated filling pressures with moderately reduced CO.  Echo:  (07/13/14); LVEF 15-20% RV moderately HK Mild MR  LHC/RHC 07/17/14 Normal coronaries RA = 3 RV = 27/2/3 PA = 29/13 (21) PCW = 5 Fick cardiac output/index = 3.3/2.3 PVR = 4.8 WU SVR = 2116 FA sat = 99% PA sat = 70%, 71% Ao Pressure: 109/67 (83) LV Pressure: 106/3/9  While in hospital had a long discussion about advanced therapiesincluding home inotropes, LVAD, and possible transplant. Pt was seen by VAD team including Dr. Prescott Gum and thought to be a candidate for LVAD as bridging therapy. Likely Heartware as opposed to Heartmate II due to patient size.   She had a large portion of her VAD screening tests in the hospital including PFTs, CT C/A/P, pre-VAD dopplers, and cMRI which were all re-assuring for her candicacy. She will need an outpatient colonoscopy, mammogram, and dental evaluation. She will also need to be evaluated for Heart transplant at [facility]. She is aware that the journey up to a VAD is a difficult process, and has been counseled on the importance of a "teammate". She has mentioned the possibility of this being one of her children or her sister. She has also  been counseled on the importance of the HF team in managing a VAD post-placement, and what types of difficulties to expect adjusting to this.   She reports today for post hospital follow up and continued VAD evaluation.  She says she has been feeling good since her d/c from the hospital, and has taken these past few days to relax due to the gravity of the situation.  She says she had been constipated for 3 days but took dulcolax OTC with relief and has had no further problems.   Breathing is about the same, still wakes up SOB sometime and has some wheezing and non-productive coughing. Has some SOB with activity around the house, but no SOB at rest. Denies edema. Has some lightheadedness but no syncope. Says she took all of her medicines about 90 minutes prior to coming to clinic Weights at home 100-103 lbs. (was 97 pounds when she got home from the hospital)  Watching salt and fluid intake.  Labs 07/19/14 K 4.0, Cr 0.74, Mg 1.9  ROS: All systems negative except as listed in HPI, PMH and Problem List.  SH:  History   Social History  . Marital Status: Married    Spouse Name: N/A  . Number of Children: N/A  . Years of Education: N/A   Occupational History  . Not on file.   Social History Main Topics  . Smoking status: Never Smoker   . Smokeless tobacco: Not on file  . Alcohol Use:  Yes     Comment: 1-2 glasses of wine every other night  . Drug Use: No  . Sexual Activity: Not on file   Other Topics Concern  . Not on file   Social History Narrative   Works full time doing clerical work.     FH:  Family History  Problem Relation Age of Onset  . Heart failure Mother     Past Medical History  Diagnosis Date  . Chronic systolic heart failure   . Hypertension     Current Outpatient Prescriptions  Medication Sig Dispense Refill  . acetaminophen (TYLENOL) 325 MG tablet Take 2 tablets (650 mg total) by mouth every 6 (six) hours as needed for mild pain or headache.    . Calcium  Carb-Cholecalciferol (CALCIUM-VITAMIN D) 600-400 MG-UNIT TABS Take 1 tablet by mouth daily.    . carvedilol (COREG) 6.25 MG tablet Take 1.5 tablets (9.375 mg total) by mouth 2 (two) times daily with a meal. 90 tablet 6  . digoxin (LANOXIN) 0.125 MG tablet Take 0.125 mg by mouth daily.    . ferrous sulfate 325 (65 FE) MG tablet Take 325 mg by mouth daily with breakfast.     . furosemide (LASIX) 40 MG tablet Take 2 tablets (80 mg total) by mouth 2 (two) times daily. 120 tablet 1  . potassium chloride SA (K-DUR,KLOR-CON) 20 MEQ tablet Take 1 tablet (20 mEq total) by mouth 2 (two) times daily. 60 tablet 6  . sacubitril-valsartan (ENTRESTO) 49-51 MG Take 1 tablet by mouth 2 (two) times daily. 60 tablet 6  . spironolactone (ALDACTONE) 25 MG tablet Take 1 tablet (25 mg total) by mouth daily. 30 tablet 6   No current facility-administered medications for this encounter.    Filed Vitals:   07/25/14 1032 07/25/14 1057  BP:  96/58  Pulse:  84  Weight: 107 lb 12.8 oz (48.898 kg)   SpO2: 97%     PHYSICAL EXAM:  General:  Well appearing. No resp difficulty HEENT: normal Neck: supple. JVP does not appear elevated. Carotids 2+ bilaterally; no bruits. No lymphadenopathy or thryomegaly appreciated. Cor: PMI laterally displaced. Regular rate & rhythm. +s3. Soft MR murmur Lungs: clear Abdomen: soft, nontender, nondistended. No hepatosplenomegaly. No bruits or masses. Good bowel sounds. Extremities: no cyanosis, clubbing, rash, edema Neuro: alert & orientedx3, cranial nerves grossly intact. Moves all 4 extremities w/o difficulty. Affect pleasant.   ASSESSMENT & PLAN: 1. Chronic systolic HF - LV EF 56-43% Moderate RV dysfunction. Cath 6/16 normal coronaries, Recent hospitalization included workup for VAD due to worsening symptoms. - Stable post-hospitalization with NYHA III - IIIB symptoms. Volume status ok. BP soft but not symptomatic - Continue current regimen - Schedule CPX test and appointment with  [facility]. Given degree of RV dysfunction, I would not want to late much longer to proceed with VAD if necessary. - Needs GI work up Colonoscopy vs Digital CT colonoscopy and mammogram 2. Dilated cardiomyopathy 3. HTN- controlled 4. Blood Type O+  Satira Mccallum Tillery PA-C 11:17 AM   Patient seen and examined with Oda Kilts, PA-C. We discussed all aspects of the encounter. I agree with the assessment and plan as stated above.   She is improved but remains tenuous. Will need to push forward expediently with transplant/VAD work-up. Will schedule CPX and evaluation at [facility]. Continue current meds. Check labs today. Extensive discussions about advanced therapies today.   Total time spent 45 minutes. Over half that time spent discussing above.  Ronneisha Jett,MD 11:39 AM

## 2014-07-29 ENCOUNTER — Telehealth (HOSPITAL_COMMUNITY): Payer: Self-pay | Admitting: Infectious Diseases

## 2014-07-29 ENCOUNTER — Telehealth (HOSPITAL_COMMUNITY): Payer: Self-pay | Admitting: *Deleted

## 2014-07-29 NOTE — Telephone Encounter (Signed)
Phone call received by patient requesting work note since Dr. Haroldine Laws does not want her working anymore d/t her current condition. Work note sent to home per patient request to deliver to employer. Met with Raquel Sarna, CSW re: disability paperwork.

## 2014-07-29 NOTE — Telephone Encounter (Signed)
Referral form with notes for heart transplant faxed to to [facility] ([facility])  Callback number: [phone]

## 2014-07-30 ENCOUNTER — Encounter (HOSPITAL_COMMUNITY): Payer: Self-pay | Admitting: Infectious Diseases

## 2014-07-30 ENCOUNTER — Encounter (HOSPITAL_COMMUNITY): Payer: BC Managed Care – PPO

## 2014-07-30 NOTE — Progress Notes (Signed)
Patient requested to be matched up with another VAD patient to meet and discuss therapy with. D/W Diana Bird and Diana Bird and we will have her meet with Diana Bird. Patient gave me permission to provide her contact information to Ms. Diana Bird for her to reach out to her.   I contacted Diana Bird today and she was agreeable to contact her and discuss/support.

## 2014-07-31 ENCOUNTER — Other Ambulatory Visit (HOSPITAL_COMMUNITY): Payer: Self-pay | Admitting: Internal Medicine

## 2014-07-31 ENCOUNTER — Ambulatory Visit (HOSPITAL_COMMUNITY): Payer: BC Managed Care – PPO | Attending: Internal Medicine

## 2014-07-31 ENCOUNTER — Other Ambulatory Visit (HOSPITAL_COMMUNITY): Payer: Self-pay | Admitting: *Deleted

## 2014-07-31 DIAGNOSIS — I5022 Chronic systolic (congestive) heart failure: Secondary | ICD-10-CM | POA: Insufficient documentation

## 2014-07-31 DIAGNOSIS — Z1231 Encounter for screening mammogram for malignant neoplasm of breast: Secondary | ICD-10-CM

## 2014-07-31 DIAGNOSIS — Z01818 Encounter for other preprocedural examination: Secondary | ICD-10-CM

## 2014-08-02 ENCOUNTER — Telehealth (HOSPITAL_COMMUNITY): Payer: Self-pay | Admitting: Infectious Diseases

## 2014-08-02 NOTE — Telephone Encounter (Signed)
Message received from Ms. Steinkamp regarding questions about LVAD. Returned call and LVM to call back when able.

## 2014-08-06 ENCOUNTER — Telehealth: Payer: Self-pay | Admitting: Licensed Clinical Social Worker

## 2014-08-06 NOTE — Telephone Encounter (Signed)
CSW rec'd referral to assist patient with questions about disability and insurance. CSW contacted patient and left message for return call. Alexis Frock [phone]

## 2014-08-08 ENCOUNTER — Ambulatory Visit (HOSPITAL_COMMUNITY)
Admission: RE | Admit: 2014-08-08 | Discharge: 2014-08-08 | Disposition: A | Discharge status: Home / Self Care | Payer: BC Managed Care – PPO | Source: Ambulatory Visit | Attending: Cardiology | Admitting: Cardiology

## 2014-08-08 ENCOUNTER — Encounter (HOSPITAL_COMMUNITY): Payer: Self-pay

## 2014-08-08 VITALS — BP 106/64 | HR 90 | Wt 108.0 lb

## 2014-08-08 DIAGNOSIS — I10 Essential (primary) hypertension: Secondary | ICD-10-CM | POA: Diagnosis not present

## 2014-08-08 DIAGNOSIS — I429 Cardiomyopathy, unspecified: Secondary | ICD-10-CM | POA: Diagnosis not present

## 2014-08-08 DIAGNOSIS — I5022 Chronic systolic (congestive) heart failure: Secondary | ICD-10-CM | POA: Diagnosis not present

## 2014-08-08 DIAGNOSIS — Z79899 Other long term (current) drug therapy: Secondary | ICD-10-CM | POA: Diagnosis not present

## 2014-08-08 LAB — DIGOXIN LEVEL: DIGOXIN LVL: 1 ng/mL (ref 0.8–2.0)

## 2014-08-08 LAB — BASIC METABOLIC PANEL
Anion gap: 9 (ref 5–15)
BUN: 8 mg/dL (ref 6–20)
CALCIUM: 9.5 mg/dL (ref 8.9–10.3)
CO2: 26 mmol/L (ref 22–32)
CREATININE: 0.85 mg/dL (ref 0.44–1.00)
Chloride: 106 mmol/L (ref 101–111)
GFR calc Af Amer: >60 mL/min (ref >60)
GFR calc non Af Amer: >60 mL/min (ref >60)
Glucose, Bld: 98 mg/dL (ref 65–99)
Potassium: 4.3 mmol/L (ref 3.5–5.1)
SODIUM: 141 mmol/L (ref 135–145)

## 2014-08-08 LAB — BRAIN NATRIURETIC PEPTIDE: B NATRIURETIC PEPTIDE 5: 1061.2 pg/mL — AB (ref 0.0–100.0)

## 2014-08-08 MED ORDER — IVABRADINE HCL 5 MG PO TABS: 5.0000 mg | ORAL_TABLET | Freq: Two times a day (BID) | ORAL | Status: DC

## 2014-08-08 NOTE — Progress Notes (Signed)
Patient ID: Diana Bird, female   DOB: 07/06/1960, 54 y.o.   MRN: [id number] Primary Cardiologist: Turner/ Bensimhon  HPI: Diana Bird  Is a 54 y/o woman with h/o systolic HF due to NICM dating back to 2007. She was admitted from the HF clinic on 07/12/14 with decompensated HF. Prior to this she had not been seen in the Clinic in approx 2 years. She was started on milrinone 6/11 due to sluggish diuresis and co-ox 44%. She was started on entresto and spiro on 07/15/14. Her co-ox gradually improved and was measured at 70% on 07/17/14 after milrinone wean.   During that admission, R and L heart catherization was performed on 07/17/14 and she was noted to have normal coronaries with severe NICM with EF 15% by echo, and well-compensated filling pressures with moderately reduced CO.  Echo:  (07/13/14); LVEF 15-20% RV moderately HK Mild MR  LHC/RHC 07/17/14 Normal coronaries RA = 3 RV = 27/2/3 PA = 29/13 (21) PCW = 5 Fick cardiac output/index = 3.3/2.3 PVR = 4.8 WU SVR = 2116 FA sat = 99% PA sat = 70%, 71% Ao Pressure: 109/67 (83) LV Pressure: 106/3/9  While in hospital had a long discussion about advanced therapiesincluding home inotropes, LVAD, and possible transplant. Pt was seen by VAD team including Dr. Prescott Gum and thought to be a candidate for LVAD as bridging therapy. Likely Heartware as opposed to Heartmate II due to patient size.   She had a large portion of her VAD screening tests in the hospital including PFTs, CT C/A/P, pre-VAD dopplers, and cMRI which were all re-assuring for her candicacy. She will need an outpatient colonoscopy, mammogram, and dental evaluation. She will also need to be evaluated for Heart transplant at [facility]. She is aware that the journey up to a VAD is a difficult process, and has been counseled on the importance of a "teammate". She has mentioned the possibility of this being one of her children or her sister. She has also been counseled on the  importance of the HF team in managing a VAD post-placement, and what types of difficulties to expect adjusting to this.   Diana Bird returns for routine followup today.  She is feeling considerably better than prior to hospitalization.  She is short of breath after walking up a flight of steps or after walking about 1 block.  Occasional PND-like symptoms but not every night.  No orthopnea, no lightheadedness, no chest pain.   CPX (6/16): peak VO2 14.1, VE/VCO2 slope 38.1 => moderate to severe cardiac functional limitation.   Labs 6/16:  K 4.0 => 4.2, Cr 0.74 => 0.91, Mg 1.9, BNP 1686  ROS: All systems negative except as listed in HPI, PMH and Problem List.  SH:  History   Social History  . Marital Status: Married    Spouse Name: N/A  . Number of Children: N/A  . Years of Education: N/A   Occupational History  . Not on file.   Social History Main Topics  . Smoking status: Never Smoker   . Smokeless tobacco: Not on file  . Alcohol Use: Yes     Comment: 1-2 glasses of wine every other night  . Drug Use: No  . Sexual Activity: Not on file   Other Topics Concern  . Not on file   Social History Narrative   Works full time doing clerical work.     FH:  Family History  Problem Relation Age of Onset  . Heart failure Mother  Past Medical History  Diagnosis Date  . Chronic systolic heart failure   . Hypertension     Current Outpatient Prescriptions  Medication Sig Dispense Refill  . acetaminophen (TYLENOL) 325 MG tablet Take 2 tablets (650 mg total) by mouth every 6 (six) hours as needed for mild pain or headache.    . Calcium Carb-Cholecalciferol (CALCIUM-VITAMIN D) 600-400 MG-UNIT TABS Take 1 tablet by mouth daily.    . carvedilol (COREG) 6.25 MG tablet Take 1.5 tablets (9.375 mg total) by mouth 2 (two) times daily with a meal. 90 tablet 6  . digoxin (LANOXIN) 0.125 MG tablet Take 0.125 mg by mouth daily.    . ferrous sulfate 325 (65 FE) MG tablet Take 325 mg by  mouth daily with breakfast.     . furosemide (LASIX) 40 MG tablet Take 2 tablets (80 mg total) by mouth 2 (two) times daily. 120 tablet 1  . potassium chloride SA (K-DUR,KLOR-CON) 20 MEQ tablet Take 1 tablet (20 mEq total) by mouth 2 (two) times daily. 60 tablet 6  . sacubitril-valsartan (ENTRESTO) 49-51 MG Take 1 tablet by mouth 2 (two) times daily. 60 tablet 6  . spironolactone (ALDACTONE) 25 MG tablet Take 1 tablet (25 mg total) by mouth daily. 30 tablet 6  . ivabradine (CORLANOR) 5 MG TABS tablet Take 1 tablet (5 mg total) by mouth 2 (two) times daily with a meal. 60 tablet 3   No current facility-administered medications for this encounter.    Filed Vitals:   08/08/14 1024  BP: 106/64  Pulse: 90  Weight: 108 lb (48.988 kg)  SpO2: 98%    PHYSICAL EXAM:  General:  Well appearing. No resp difficulty HEENT: normal Neck: supple. JVP does not appear elevated. Carotids 2+ bilaterally; no bruits. No lymphadenopathy or thryomegaly appreciated. Cor: PMI laterally displaced. Regular rate & rhythm. No s3. No murmur. Lungs: clear Abdomen: soft, nontender, nondistended. No hepatosplenomegaly. No bruits or masses. Good bowel sounds. Extremities: no cyanosis, clubbing, rash, edema Neuro: alert & orientedx3, cranial nerves grossly intact. Moves all 4 extremities w/o difficulty. Affect pleasant.  ASSESSMENT & PLAN: 1. Chronic systolic HF: Nonischemic cardiomyopathy.  LV EF 15-20% with moderate RV dysfunction by echo. Cath 6/16 normal coronaries.  Recent hospitalization included workup for VAD due to worsening symptoms.  CPX with moderate to severe cardiac functional limitation.  Symptomatically, she is improved.  NYHA class III symptoms.  HR still elevated with rate in 90s.  She is not volume overloaded on exam.  - Continue current Coreg and Entresto.  BP is a bit soft at this time for titration.  - Continue digoxin and spironolactone.  Will check digoxin level today.  - Continue Lasix 80 mg bid.   BMET/BNP today.  - Given elevated HR (and do not want to increase Coreg with soft BP), will add Corlanor 2.5 mg bid.  Can increase to 5 mg bid after a week if she tolerates it ok.  - She has appt at [facility] transplant clinic. Given degree of RV dysfunction, I would not want to wait much longer to proceed with VAD if necessary.  She understands that we may need to proceed with LVAD, especially if she has any clinical worsening.  - Needs GI work up for LVAD => scheduled for CT colonography. 2. Blood type O+.   Loralie Champagne 08/08/2014

## 2014-08-08 NOTE — Patient Instructions (Signed)
START CORLANOR 2.5mg  (1/2 tablet) twice a day for a week then TAKE 5mg  (1 tablet) daily.  LABS today (bmet bnp dig)   FOLLOW UP in 3 weeks.

## 2014-08-12 ENCOUNTER — Telehealth: Payer: Self-pay | Admitting: Licensed Clinical Social Worker

## 2014-08-12 NOTE — Telephone Encounter (Signed)
CSW received call from patient with questions regarding her disability application. Patient also asked about attending the LVAD Support Group to meet LVAD patients as she stated "I am scared". CSW provided supportive intervention around issues of adjustment to illness and disability and encouraged her to attend the support group to reduce fears and concerns about possible LVAD surgery. CSW will continue to follow for support and LVAD assessment process. Raquel Sarna, [place]

## 2014-08-14 ENCOUNTER — Telehealth: Payer: Self-pay

## 2014-08-14 NOTE — Telephone Encounter (Signed)
Corlanor approved from [employer]. Case ID #[id number]. Good through 08/13/2015. Pharmacy notified.

## 2014-08-16 ENCOUNTER — Ambulatory Visit
Admission: RE | Admit: 2014-08-16 | Discharge: 2014-08-16 | Disposition: A | Discharge status: Home / Self Care | Payer: BC Managed Care – PPO | Source: Ambulatory Visit | Attending: Internal Medicine | Admitting: Internal Medicine

## 2014-08-16 DIAGNOSIS — Z01818 Encounter for other preprocedural examination: Secondary | ICD-10-CM

## 2014-08-19 ENCOUNTER — Encounter: Payer: Self-pay | Admitting: Licensed Clinical Social Worker

## 2014-08-20 ENCOUNTER — Other Ambulatory Visit (HOSPITAL_COMMUNITY): Payer: Self-pay | Admitting: Infectious Diseases

## 2014-08-20 ENCOUNTER — Telehealth (HOSPITAL_COMMUNITY): Payer: Self-pay | Admitting: Infectious Diseases

## 2014-08-20 ENCOUNTER — Encounter (HOSPITAL_COMMUNITY): Payer: Self-pay | Admitting: Infectious Diseases

## 2014-08-20 ENCOUNTER — Telehealth: Payer: Self-pay | Admitting: Licensed Clinical Social Worker

## 2014-08-20 ENCOUNTER — Encounter: Payer: Self-pay | Admitting: Gastroenterology

## 2014-08-20 NOTE — Progress Notes (Signed)
CSW met with patient at Support Group. Patient had requested to attend to meet other VAD patients and have opportunity to ask questions. Patient enjoyed meeting others and discussed at length the VAD process. Patient stated that she is "scared" about the process. CSW provided support and encouraged patient to continue asking questions to alleviate her concerns. Patient appeared relived and looking forward to her visit to [place] next week for transplant evaluation. CSW continues to follow for support and LVAD assessment process. Raquel Sarna, [place]

## 2014-08-20 NOTE — Progress Notes (Signed)
Appointment made for colonoscopy to be done by Dr. Fuller Plan with [facility] August 4th with pre-procedure check in at 0800. Pre-Op appointment is scheduled for July 27th, 0830. Will fax clinic notes to [phone]. They will contact the patient for further instructions and details around the appointment.

## 2014-08-20 NOTE — Telephone Encounter (Signed)
Called Ms. Leonetti to discuss results of her recent CT Colonography showing a probable cecal polyp. Informed her that Dr. Haroldine Laws advised for her to have a full screening colonoscopy to further evaluate her transplant/VAD candidacy. Verbalized she understood. Advised that I will be contacting [facility] for assistance on Dr. Clayborne Dana behalf to arrange Colonoscopy procedure and they will be contacting her with details.   Answered questions regarding [facility]'s testing needs and that she heard from the support group that they "do duplicate testing." I assured her that she will likely have to have further blood tests done that we as a DT center do not perform that [facility] as a transplant center requires for evaluation. I advised her to be her own advocate and let them know what testing she has already had done to avoid duplication. I will have radiology make a CD of her recent CT Colonography, cMRI and CT C/A/P along with all written results, VAD clinical summary and other necessary health records ready for Ms. Katen to pick up and take with her to her transplant appointment on 08/29/14. Verbalized she would pick up that day prior to travel.   All questions answered and clarified during this phone conversation.

## 2014-08-20 NOTE — Telephone Encounter (Signed)
CSW contacted patient to follow up on visit last night. Patient stated she is doing well and was glad for the opportunity to meet with VAD patients last night. Patient asked many questions about VAD process and will follow up with Janene Madeira, RN VAD Coordinator. CSW will continue to follow for supportive intervention during this assessment process. Raquel Sarna, [place]

## 2014-08-20 NOTE — Progress Notes (Signed)
[place] CLINICAL SOCIAL WORK DOCUMENTATION LVAD (Left Ventricular Assist Device) Psychosocial Screening Please remember that all information is confidential within the members of the VAD team and [facility]  07/16/2014 10:15am  Patient:  Diana Bird  MRN:  [id number]  Account:  [id number]  Clinical Social Worker:  Ulla Gallo, LCSW Date/Time Initiated:   07/16/2014 10:15 AM Referral Source:    Cindie Laroche Coordinator  Referral Reason:   LVAD Implantation  Source of Information:   Patient and chart review   PATIENT DEMOGRAPHICS NAME:   Diana Bird     DOB:  [birth year]  SS#:  [id number] Address:   [place] [address] Home Phone:  [phone]  Cell Phone:    Marital Status:   single (divorced 3 years ago)     Primary Language:  ENGLISH  Faith:  [employer] Adherence with Medical Regimen:   compliant  Medication Adherence:   compliant  Physician appointment attendance:   compliant   Do you have a Living Will or Medical POA?  N Would you like to complete a Living Will and Medical POA prior to surgery?  N Are you currently a DNR?  N Do you have a MOST form?  N Would you like to review one?  N Do you have goals of care?  Y   Have you had a consult with the palliative care team at [facility]?  N Comment:   Psychological Health Appearance:   Patient was well groomed and sitting up in hospital bed.  Mental status:   alert and oriented  Eye Contact:   good  Thought Content:   Patient was clear and verbalized thoughts clearly  Speech:   clear  Mood:   Patient was in good spirits and very interactive  Affect:   positive  Insight:   realistic and appropriate  Judgment:   sound  Interaction Style:   realistic and appropriate   Family/Social Information Who lives in your home? Name9  lives alone      Do you have a plan for child care if relevant?   n/a  List family members outside the  home (parents, friends, Theme park manager, etc..) Name2  Ricky Hunkele  Nicole Kindred Bing  Felicia Mooney  Helene Shoe Daves    Relationship to [place]  son  ex-husband  sister  cousin    Please list people who give you emotional support (family, parents, friends, pastor, etc..) Name3  n/a      Who is your primary and backup support pre and post-surgery? Explain the relationships i.e. strengths/weakness, etc.:   Patient states she has a few options and will discuss further with family members before making decision.  Secondary Caregiver identified:   Legal Do you currently have any legal issues/problems?   none noted  Durable POA or Legal Next of Kin:   none at the moment   Living situation Travel distance to [facility]:  5 minutes  Second Hand Smoke Exposure:   none  Self- Care level:   Independent  Ambulation:   Independent  Transportation:   drives self and Ronalee Belts (boyfriend)  Limitations:   denies any  Barriers impacting ability to participate in care:   debies any   Community Are you active with community agencies/resources?   no  Are you active in a church, synagogue, mosque, or other faith based community?   no  What other sources do you have for spiritual support?   Read my Bible  Are you active in any clubs or social organizations?   not at this time  What do you do for fun? hobbies, interests?  Going to [employer], any outside activity   Education/Work information What is the last grade of school you completed?  2 yrs college  Preferred method of learning? (Written, verbal, hands on):   written and hands on  Do you have any problems with reading or writing?  no  Are you currently employed? If no, when were you last employed?   yes  Name of employer:   [employer]  Please describe what kind of work you do/did?   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   10 years  If you are not  currently working, do you plan to return to work after VAD surgery?   will consider returning  If yes, what type of employment do you hope to find?   current employer  Are you interested in job training or learning new skills?   no  Did you serve in the [employer]? If so, what branch?   no   Financial Information What is your source of income?    employed- Office manager for [employer]  Do you have difficulty meeting your monthly expenses?   not at this time  If yes, which ones?    n/a  How do you usually cope with this?    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    no  Have you applied for Social Security Disability?    considering application  Do you have prescription coverage?    yes  What are your prescription co-pays?    varies  Are you required to use a certain pharmacy?    [facility]  Do you have a mail order option for your prescriptions?    yes  If yes, what pharmacy do you use for mail order?   Have you ever refused medication due to cost?    no  Discuss monthly cost for dressing supplies post procedure $150-300    discussed and reviewed  Can you budget for this monthly expense?    "will work it outVisual merchandiser Information Briefly describe your medical history, surgeries and why you are here for evaluation.    Patient states she was diagnosed with CHF and pneumonia. She reports she had a stomach virus and a lung infection as well. She stated "one thing after another and not getting any better". During March and April she was at the MD office weekly and continued to decline. She was admitted due to edema in legs and thighs.  Are you able to complete your ADL's?    yes  Do you have any history of emotional, medical, physical or verbal trauma?    "childhood trauma"  Do you have any family history of heart problems?    Mother died of heart disease  at age 55.  Do you smoke? If so, what is the amount and frequency?    no,  past usage in High School  Do you drink alcohol? If so, how many drinks a day/week?    Have not had a drink in 5 years. Past usage socially  Have you ever used illegal drugs or misused medications?    no  If yes, what drugs do  you use and how often?   Have you ever been treated for substance abuse?    no  If yes, where and when were you treated?   Are you currently using illegal substances?    no   Mental Health History Have you ever had problems with depression, anxiety or other mental health issues?   general life stressors  If yes, have you seen a counselor, psychiatrist or therapist?    Patient reports she last saw a counselor a year ago. She states that she saw a counselor on and off over the years. "trying to make sure I was on the right path- nothing in particular".  If you are currently experiencing problems are you interested in talking with a professional?    denies any at the moment  Would you be interested in participating in an LVAD support group?  Y LVAD support group for: Patient Caregivers patient Have you or are you taking any medications for anxiety/depression or any mental health concerns?    no  If yes, Please list the medications?   How have you been feeling in the past year?    "very well"  How do you handle stressful situations?   She states that "sometimes I let things build up or just deal with it at the moment"  What are your coping strategies? Please List:    "get alone and think about it"  Have you had any past or current thoughts of suicide?    no  How many hours do you sleep at night?    6-8 hours  How is your appetite?   poor although coming back  PHQ2- Depression Screen:    5  PHQ9 Depression screen (only complete if the PHQ2 is positive):    HADS score- 16   Plan for VAD Implementation Do you know and understand what happens during VAD surgery?  Please describe your  thoughts:   Patient verbalized understanding of the surgery.  What do you know about the risks of any major surgery or use of general anesthesia?    Patient verbalized understanding of risk factors, death, stroke and infection.  What do you know about the risks and side effects associated with VAD surgery?    Patient's understanding of risks were accurate and appropriate.  Explain what will happen right after surgery?    Patient understands transfer to ICU and will be intubated. Then transfer to regular floor once stable and off vent.  Information obtained from:    Patient  What is your plan for transportation for the first 8 weeks post-surgery? (Patients are not recommended to drive post-surgery for 8 weeks)    Patient reports Ronalee Belts and a few other options to assist with transport needs.  Driver: Ronalee Belts- boyfriend  Valid license:    yes  Working Conservator, museum/gallery:    1997 Ford F150  Airbags:  ? Do you plan to disarm the airbags- there is a risk of discharging the device if the airbag were to deploy.   Discussed and reviewed- patient will consider  What do you know about your diet post-surgery?    heart healthy  How do you plan to monitor your medications, current and future?    pillbox- self pour  How do you plan to complete ADL's post-surgery (Shower, dress, etc.)?    Patient states she will ask for help.  Will it be difficult to ask for help for your caregivers? If so, explain:    Patient dneies any concenrs.  Please explain what you hope will be improved about your life as a result of receiving the VAD    "be back doing things"  Please tell me your biggest concern or fear about receiving the VAD?    "how big it looks- the surgery"  How do you cope with your concerns/fears?    "taking it all in- marinate on it"  Please explain your understanding of how your body will change? Are you worried about these changes?    "haven't thought that far yet but not the most important  thing to me"  Do you see any barriers to your surgery or follow up? If yes, please explain:   none at this time     Understanding of LVAD Surgical procedures and risks:    discussed and reviewed  Electrical need for LVAD:    confirmed 3 prong outlets  Safety precautions with LVAD (Water, etc.):   Patient verbalizes understanding of water safety  Potential side effects with LVAD:    Discussed and reviewed  Types of Advanced heart failure therapies available:    Discussed and reviewed- additional information provided by LVAD Coordinator  LVAD daily self-care (dressing changes, computer check, extra supplies):    Discussed and reviewed  Outpatient follow-up (follow-up in LVAD clinic; monitoring blood thinners)    Discussed and reviewed  Need for emergency planning:    Discussed and reviewed  Expectations for LVAD:    accurate and realistic  Current level of motivation to prepare for LVAD:    Patient appears motivated although still unsure about implant of LVAD.  Patient's perception of need for LVAD:    Patient is considering all of her options.  Present level of consent for LVAD:    Patient still considering option  Reasons for seeking LVAD:    accurate and realistic   Psychosocial Protective Factors  Ms Alamillo has a good outlook although hopeful other advanced therapies improve her health to avoid LVAD implantation. Patient reports good follow through and compliance with medical regimen. She lives alone although has a very supportive boyfriend Ronalee Belts who visits often and available to stay if needed. She is divorced (3 years ago) although maintains a good relationship with her ex husband who she noted as a possible caregiver. She noted many family and friends as support systems. She is unsure who would be her primary caregiver. She enjoys going to the zoo and participating in outdoor activities. She has completed 2 yrs... of college and prefers to learn with written and hands on.  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 She is currently on FMLA and exploring future benefits with her employer. She plans to apply for SSD. Although she scored a 5 on the PHQ-2 and HADS score of 16 she appears to have good coping mechanisms and seeks supportive counseling when needed. She appears to a through research about the VAD to determine if this is a good option for her. She plans to attend a LVAD Support Group to assist with her decision making. Psychosocial Risk Factors  Ms Knodel does not have Advanced Directives or a Medical POA although is exploring her options. She is unsure of who her primary caregiver will be and is exploring her options with family members. She is currently on FMLA although unsure about insurance benefits and will explore insurance/benefits and income with employer and apply for SSD. She is aware of her elevated score on PHQ2 (5)and HADS (16) but denies need for  clinical supportive counseling at this time.  Clinical Intervention: CSW will assist with supportive counseling as needed and meet with patient again to review risk factors and reassess for LVAD appropriateness.   Educated patient/family on the following Caregiver(s) role responsibilities:   duscussed and reviewed: Patient exploring options with family to determine who will be primary caregiver.  Financial planning for LVAD:    Discussed potential needs for LVAD implantation and will continue to follow and  review throughout assessment process.  Role of Clinical Social Worker:    Discussed and reviewed  Signs of depression and anxiety:    discussed and reviewed  Support planning for LVAD:    Discussed and reviewed  LVAD process:    Discussed and reviewed- most information provided by LVAD Coordinator  Caregiver contract/agreement:   Discussed and reviewed  Discussed Referral(s) to:    Application for SSD  Supportive Counseling as needed  [employer]:    None at  this time- CSW will continue to assess need.  Clinical Impression Recommendations:    Ms Bernardi is still gathering information and going through the assessment process for LVAD implantation. She is planning to attend the July LVAD Support Group the assist with her decision making. Patient appears to have good coping and potentially a good candidate although is in need of confirming a primary caregiver and exploring insurance and income with her employer. CSW will continue to explore with patient and reassess with patient when ready. Raquel Sarna, [place]

## 2014-08-21 ENCOUNTER — Telehealth: Payer: Self-pay | Admitting: *Deleted

## 2014-08-21 NOTE — Telephone Encounter (Signed)
Needs new patient office visit with me or APP. If she sees APP she can be scheduled with any MD as I have not seen her before.

## 2014-08-21 NOTE — Telephone Encounter (Signed)
Dr Fuller Plan, This lady is scheduled for a PV on 7-27 and then a direct colon with you on 8-4, Thursday .  She has a very complicated cardiac history due to chronic diastolic and systolic heart failure and Dialated cardio myopathy . She had a cardiac cath 07-17-2014 with an EF of 15%. She has a note that states she needs a screening colonoscopy to evaluate for possible candidacy for a transplant.  Do you want her to have an OV prior or can she be a direct hospital colon ?  And is APP ok if OV?  Please advise. Thanks for your time Marijean Niemann

## 2014-08-21 NOTE — Telephone Encounter (Signed)
[place] on number in chart. Pt needs Ov per dr stark   Lelan Pons PV

## 2014-08-21 NOTE — Telephone Encounter (Signed)
[facility]

## 2014-08-22 NOTE — Telephone Encounter (Signed)
Called work number and instructed pt is out on FMLA and attempted home number with no answer, [facility]  At [phone].  Lelan Pons PV

## 2014-08-22 NOTE — Telephone Encounter (Signed)
Finally spoke with patient and informed her we need to cancel her PV and schedule her an OV per Dr Fuller Plan due to her complicated medical history. Pt very adamant that she needs to be seen as soon as possible and questioned if Dr Acquanetta Sit was aware of this. Instructed pt this is our office policy  Due to her cardiac hx, her Ef 15 %. Gave an Ov for Monday at 3 pm with Alonza Bogus, PA. Instructed pt to our address, phone number, time and location to check in. All questions answered for patient.  She verbalized understanding of reason and need to have this OV.  She was instructed her colon will need to be done in the hospital setting. Lelan Pons PV

## 2014-08-26 ENCOUNTER — Encounter: Payer: Self-pay | Admitting: Gastroenterology

## 2014-08-26 ENCOUNTER — Ambulatory Visit (INDEPENDENT_AMBULATORY_CARE_PROVIDER_SITE_OTHER): Payer: BC Managed Care – PPO | Admitting: Gastroenterology

## 2014-08-26 VITALS — BP 80/60 | HR 64 | Ht 62.0 in | Wt 108.0 lb

## 2014-08-26 DIAGNOSIS — R933 Abnormal findings on diagnostic imaging of other parts of digestive tract: Secondary | ICD-10-CM | POA: Diagnosis not present

## 2014-08-26 DIAGNOSIS — Z1211 Encounter for screening for malignant neoplasm of colon: Secondary | ICD-10-CM

## 2014-08-26 NOTE — Progress Notes (Signed)
Agree with Ms. Zehr's management.  Kaid Seeberger E. Nateisha Moyd, MD, FACG  

## 2014-08-26 NOTE — Patient Instructions (Signed)
We will call you with a time and date for a Colonoscopy at [facility].

## 2014-08-26 NOTE — Progress Notes (Signed)
08/26/2014 Diana Bird [id number] [birth year]   HISTORY OF PRESENT ILLNESS:  This is a 54 year old female who has CHF with an EF of 20% and is in need of a heart transplant; being evaluated at [facility].  Needed screening for colon cancer.  Virtual CT scan ordered by Dr. Haroldine Laws showed the following:  IMPRESSION: 1. Moderate colonic distension, with significant degradation primarily on prone images. 2. Probable 6 mm cecal polyp. C rads category C2. Intermediate polyp or indeterminate finding. Surveillance or colonoscopy recommended (evidence suggests that colonoscopy can delayed at least 3 years, subject to individual patient circumstances). 3. Sigmoid colon area of apparent soft tissue fullness is favored to be due to underdistention. If the patient undergoes follow-up colonoscopy, recommend attention to this area.  Due to these findings she was sent here to schedule colonoscopy.  Past Medical History  Diagnosis Date  . Chronic systolic heart failure   . Hypertension   . Anemia   . Pneumonia   . Sleep apnea    Past Surgical History  Procedure Laterality Date  . None    . Cardiac catheterization N/A 07/17/2014    Procedure: Right/Left Heart Cath and Coronary Angiography;  Surgeon: Jolaine Artist, MD;  Location: [facility];  Service: Cardiovascular;  Laterality: N/A;    reports that she has never smoked. She has never used smokeless tobacco. She reports that she does not drink alcohol or use illicit drugs. family history includes Breast cancer in her sister; Heart failure in her mother. There is no history of Colon cancer or Colon polyps. No Known Allergies    Outpatient Encounter Prescriptions as of 08/26/2014  Medication Sig  . acetaminophen (TYLENOL) 325 MG tablet Take 2 tablets (650 mg total) by mouth every 6 (six) hours as needed for mild pain or headache.  . Calcium Carb-Cholecalciferol (CALCIUM-VITAMIN D) 600-400 MG-UNIT TABS Take 1 tablet by mouth  daily.  . carvedilol (COREG) 6.25 MG tablet Take 1.5 tablets (9.375 mg total) by mouth 2 (two) times daily with a meal.  . digoxin (LANOXIN) 0.125 MG tablet Take 0.125 mg by mouth daily.  . ferrous sulfate 325 (65 FE) MG tablet Take 325 mg by mouth daily with breakfast.   . furosemide (LASIX) 40 MG tablet Take 2 tablets (80 mg total) by mouth 2 (two) times daily.  . ivabradine (CORLANOR) 5 MG TABS tablet Take 1 tablet (5 mg total) by mouth 2 (two) times daily with a meal.  . potassium chloride SA (K-DUR,KLOR-CON) 20 MEQ tablet Take 1 tablet (20 mEq total) by mouth 2 (two) times daily.  . sacubitril-valsartan (ENTRESTO) 49-51 MG Take 1 tablet by mouth 2 (two) times daily.  Marland Kitchen spironolactone (ALDACTONE) 25 MG tablet Take 1 tablet (25 mg total) by mouth daily.   No facility-administered encounter medications on file as of 08/26/2014.     REVIEW OF SYSTEMS  : All other systems reviewed and negative except where noted in the History of Present Illness.   PHYSICAL EXAM: BP 80/60 mmHg  Pulse 64  Ht 5\' 2"  (1.575 m)  Wt 108 lb (48.988 kg)  BMI 19.75 kg/m2 General: Well developed female in no acute distress Head: Normocephalic and atraumatic Eyes:  Sclerae anicteric, conjunctiva pink. Ears: Normal auditory acuity Lungs: Clear throughout to auscultation Heart: Regular rate and rhythm Abdomen: Soft, non-distended.  Normal bowel sounds.  Non-tender. Rectal:  Will be done at the time of colonoscopy. Musculoskeletal: Symmetrical with no gross deformities  Skin: No lesions  on visible extremities Extremities: No edema  Neurological: Alert oriented x 4, grossly non-focal Psychological:  Alert and cooperative. Normal mood and affect  ASSESSMENT AND PLAN: -Screening colonoscopy:  Abnormal virtual CT scan showing possible colon polyps.  Patient needs colonoscopy for possible heart transplant.  Will schedule.  The risks, benefits, and alternatives were discussed with the patient and she consents to  proceed.    CC:  Bensimhon, Shaune Pascal, MD

## 2014-08-27 ENCOUNTER — Telehealth: Payer: Self-pay

## 2014-08-27 DIAGNOSIS — Z1211 Encounter for screening for malignant neoplasm of colon: Secondary | ICD-10-CM

## 2014-08-27 NOTE — Telephone Encounter (Signed)
Called patient to inform her of her appointment at [facility] for a Colonoscopy with Dr. Carlean Purl on 09/09/2014 at 12:30 pm. Left voicemail for patient to call back.

## 2014-08-27 NOTE — Telephone Encounter (Signed)
Patient's primary care office called and stated the it is preferred that the patient has a colonoscopy at [facility] [place]. Rescheduled patient for 09/09/2014 at 8:30 am. Called patient and gave her new instructions. Patient understands time changes.

## 2014-08-27 NOTE — Telephone Encounter (Signed)
-----   Message from Margreta Journey, [place] sent at 08/26/2014  4:51 PM EDT ----- Regarding: Schedule Diana Bird for a Colon with Dr Carlean Purl for the week on the 8th. Patient cannot do the 11th-23rd.

## 2014-08-27 NOTE — Telephone Encounter (Signed)
Patient called back and I explained her procedure over the phone. She understands.

## 2014-09-02 ENCOUNTER — Encounter (HOSPITAL_COMMUNITY): Payer: BC Managed Care – PPO

## 2014-09-05 ENCOUNTER — Encounter: Payer: BC Managed Care – PPO | Admitting: Gastroenterology

## 2014-09-05 ENCOUNTER — Encounter (HOSPITAL_COMMUNITY): Payer: BC Managed Care – PPO

## 2014-09-06 ENCOUNTER — Encounter (HOSPITAL_COMMUNITY): Payer: Self-pay | Admitting: *Deleted

## 2014-09-06 NOTE — Progress Notes (Signed)
Anesthesia Chart Review:  Pt is 54 year old female scheduled for colonoscopy with propofol on 09/09/2014 with Dr. Carlean Purl.   Cardiologists are Dr. Radford Pax and Dr. Haroldine Laws, last office visit 08/08/2014 with Dr. Aundra Dubin.   PMH includes: nonischemic cardiomyopathy (EF15-20%), CHF, HTN, anemia, OSA. Never smoker. BMI 20.   Pt hospitalized 6/10-6/17/2016 for acute on chronic systolic HF, cardiogenic shock. Heart transplant was recommended, pt has initiated eval for transplant at [facility]. Pt is also being considered for LVAD as bridging therapy prior to transplant. Colonoscopy is part of LVAD/transplant eval to rule out colon cancer.   Medications include: carvedilol, digoxin, ferrous sulfate, lasix, ivabradine, potassium, sacubitril-valsartan, spironolactone.   Chest x-ray 07/17/2014 reviewed. Cardiac enlargement. No edema or consolidation.   EKG 07/17/2014: NSR. Left atrial enlargement. Left axis deviation. Incomplete RBBB. LAFB.   Cardiopulmonary exercise test 07/31/2014: -Exercise testing with gas exchange demonstrates a moderate to severe functional impairment when compared to matched sedentary norms. There appears to be a moderate obstruction. Patient was hypotensive at rest and during exercise, with a blunted HR response reflecting the circulatory limitation. O2 pulse (surrogate for stroke volume) was low/flat indicating diastolic dysfunction. Elevated VE/VCO2 slope predicts worse short-term prognosis with HF. Patient is currently following advanced therapies processes for LVAD and BTT.  Vascular US 07/17/2014:  -B ICA stenosis 1-39% -B ABIs are within normal limits -Unable to assess R brachial artery due to PICC line  R and L cardiac cath 07/17/2014:  1) Normal coronary arteries 2) Severe NICM with EF 15% by echo 3) Well compensated filling pressures with moderately reduced cardiac output -Plan: Cath looks good. Coronaries are clean. She is well diuresed. CVP is reassuring despite significant RV  dysfunction on echo. Continue with VAD work-up.   PFTs 07/16/2014: The diffusion defect and reduced lung volumes suggest an early parenchymal process. Moderate airway obstruction is present. A clinical trial of bronchodilators may be beneficial in view of the airway obstruction. In view of the severity of the diffusion defect, studies with exercise would be helpful to evaluate the presence of hypoxemia.  Echo 07/13/2014:  - Left ventricle: The cavity size was normal. Wall thickness wasnormal. The estimated ejection fraction was 20%. Diffusehypokinesis. - Mitral valve: There was mild regurgitation. - Right ventricle: The cavity size was moderately dilated. - Right atrium: The atrium was severely dilated. - Atrial septum: A patent foramen ovale cannot be excluded.  Reviewed case with Dr. Marcie Bal. Pt will need further assessment on the day of procedure by her assigned anesthesiologist.   Willeen Cass, FNP-BC [facility]/Anesthesiology Phone: [phone] 09/06/2014 3:48 PM

## 2014-09-09 ENCOUNTER — Encounter (HOSPITAL_COMMUNITY): Payer: Self-pay | Admitting: Certified Registered Nurse Anesthetist

## 2014-09-09 ENCOUNTER — Encounter (HOSPITAL_COMMUNITY): Admission: AD | Payer: Self-pay | Source: Ambulatory Visit

## 2014-09-09 ENCOUNTER — Encounter (HOSPITAL_COMMUNITY)
Admission: RE | Disposition: A | Discharge status: Home / Self Care | Payer: Self-pay | Source: Ambulatory Visit | Attending: Gastroenterology

## 2014-09-09 ENCOUNTER — Ambulatory Visit (HOSPITAL_COMMUNITY): Payer: BC Managed Care – PPO | Admitting: Emergency Medicine

## 2014-09-09 ENCOUNTER — Ambulatory Visit (HOSPITAL_COMMUNITY)
Admission: RE | Admit: 2014-09-09 | Discharge: 2014-09-09 | Disposition: A | Discharge status: Home / Self Care | Payer: BC Managed Care – PPO | Source: Ambulatory Visit | Attending: Gastroenterology | Admitting: Gastroenterology

## 2014-09-09 ENCOUNTER — Ambulatory Visit (HOSPITAL_COMMUNITY)
Admission: AD | Admit: 2014-09-09 | Payer: BC Managed Care – PPO | Source: Ambulatory Visit | Admitting: Internal Medicine

## 2014-09-09 DIAGNOSIS — R933 Abnormal findings on diagnostic imaging of other parts of digestive tract: Secondary | ICD-10-CM

## 2014-09-09 DIAGNOSIS — I10 Essential (primary) hypertension: Secondary | ICD-10-CM | POA: Insufficient documentation

## 2014-09-09 DIAGNOSIS — I429 Cardiomyopathy, unspecified: Secondary | ICD-10-CM | POA: Diagnosis not present

## 2014-09-09 DIAGNOSIS — Z86010 Personal history of colonic polyps: Secondary | ICD-10-CM

## 2014-09-09 DIAGNOSIS — Z79899 Other long term (current) drug therapy: Secondary | ICD-10-CM | POA: Diagnosis not present

## 2014-09-09 DIAGNOSIS — I5022 Chronic systolic (congestive) heart failure: Secondary | ICD-10-CM | POA: Diagnosis not present

## 2014-09-09 DIAGNOSIS — D120 Benign neoplasm of cecum: Secondary | ICD-10-CM | POA: Diagnosis not present

## 2014-09-09 DIAGNOSIS — Z860101 Personal history of adenomatous and serrated colon polyps: Secondary | ICD-10-CM

## 2014-09-09 DIAGNOSIS — R948 Abnormal results of function studies of other organs and systems: Secondary | ICD-10-CM | POA: Diagnosis present

## 2014-09-09 DIAGNOSIS — G4733 Obstructive sleep apnea (adult) (pediatric): Secondary | ICD-10-CM | POA: Insufficient documentation

## 2014-09-09 HISTORY — PX: COLONOSCOPY WITH PROPOFOL: SHX5780

## 2014-09-09 HISTORY — DX: Personal history of colonic polyps: Z86.010

## 2014-09-09 HISTORY — DX: Benign neoplasm of cecum: D12.0

## 2014-09-09 HISTORY — DX: Personal history of adenomatous and serrated colon polyps: Z86.0101

## 2014-09-09 HISTORY — DX: Reserved for inherently not codable concepts without codable children: IMO0001

## 2014-09-09 HISTORY — DX: Heart failure, unspecified: I50.9

## 2014-09-09 SURGERY — COLONOSCOPY WITH PROPOFOL
Anesthesia: Monitor Anesthesia Care

## 2014-09-09 SURGERY — COLONOSCOPY
Anesthesia: Monitor Anesthesia Care

## 2014-09-09 MED ORDER — ETOMIDATE 2 MG/ML IV SOLN
INTRAVENOUS | Status: DC | PRN
  Administered 2014-09-09 (×4): 4 mg via INTRAVENOUS

## 2014-09-09 MED ORDER — MIDAZOLAM HCL 2 MG/2ML IJ SOLN
INTRAMUSCULAR | Status: AC
  Filled 2014-09-09: qty 4

## 2014-09-09 MED ORDER — LACTATED RINGERS IV SOLN
INTRAVENOUS | Status: DC
  Administered 2014-09-09: 1000 mL via INTRAVENOUS
  Administered 2014-09-09: 09:00:00 via INTRAVENOUS

## 2014-09-09 MED ORDER — ONDANSETRON HCL 4 MG/2ML IJ SOLN
INTRAMUSCULAR | Status: DC | PRN
  Administered 2014-09-09: 4 mg via INTRAVENOUS

## 2014-09-09 MED ORDER — FENTANYL CITRATE (PF) 100 MCG/2ML IJ SOLN
25.0000 ug | INTRAMUSCULAR | Status: DC | PRN
Start: 2014-09-09 — End: 2014-09-09

## 2014-09-09 MED ORDER — PROMETHAZINE HCL 25 MG/ML IJ SOLN: 6.2500 mg | INTRAMUSCULAR | Status: DC | PRN

## 2014-09-09 MED ORDER — SODIUM CHLORIDE 0.9 % IV SOLN: INTRAVENOUS | Status: DC

## 2014-09-09 MED ORDER — MEPERIDINE HCL 100 MG/ML IJ SOLN: 6.2500 mg | INTRAMUSCULAR | Status: DC | PRN

## 2014-09-09 MED ORDER — MIDAZOLAM HCL 2 MG/2ML IJ SOLN
0.5000 mg | Freq: Once | INTRAMUSCULAR | Status: AC | PRN
  Administered 2014-09-09: 2 mg via INTRAVENOUS

## 2014-09-09 NOTE — Addendum Note (Signed)
Addendum  created 09/09/14 1234 by Lowella Dell, CRNA   Modules edited: Notes Section   Notes Section:  File: [id number]

## 2014-09-09 NOTE — Interval H&P Note (Signed)
History and Physical Interval Note:  09/09/2014 8:32 AM  Diana Bird  has presented today for surgery, with the diagnosis of screening  The various methods of treatment have been discussed with the patient and family. After consideration of risks, benefits and other options for treatment, the patient has consented to  Procedure(s): COLONOSCOPY WITH PROPOFOL (N/A) as a surgical intervention .  The patient's history has been reviewed, patient examined, no change in status, stable for surgery.  I have reviewed the patient's chart and labs.  Questions were answered to the patient's satisfaction.     Silvano Rusk

## 2014-09-09 NOTE — Op Note (Signed)
[facility] [place], [id number]   COLONOSCOPY PROCEDURE REPORT  PATIENT: Diana, Bird  MR#: [id number] BIRTHDATE: 12/18/1960 , 40  yrs. old GENDER: female ENDOSCOPIST: Gatha Mayer, MD, [facility] REFERRED BY: PROCEDURE DATE:  09/09/2014 PROCEDURE:   Colonoscopy, diagnostic and Colonoscopy with snare polypectomy First Screening Colonoscopy - Avg.  risk and is 50 yrs.  old or older - No.  Prior Negative Screening - Now for repeat screening. N/A  History of Adenoma - Now for follow-up colonoscopy & has been > or = to 3 yrs.  N/A  Polyps removed today? Yes ASA CLASS:   Class III INDICATIONS:polyp on CT colonoscopy. MEDICATIONS: Monitored anesthesia care and Per Anesthesia  DESCRIPTION OF PROCEDURE:   After the risks benefits and alternatives of the procedure were thoroughly explained, informed consent was obtained.  The digital rectal exam revealed no abnormalities of the rectum.   The [employer]ax Adult Colon [id number] endoscope was introduced through the anus and advanced to the cecum, which was identified by both the appendix and ileocecal valve. No adverse events experienced.   The quality of the prep was (MiraLax was used) good.  The instrument was then slowly withdrawn as the colon was fully examined. Estimated blood loss is zero unless otherwise noted in this procedure report.      COLON FINDINGS: A polypoid shaped sessile polyp measuring 6 mm in size was found at the cecum.  A polypectomy was performed with a cold snare.  The resection was complete, the polyp tissue was completely retrieved and sent to histology.   The examination was otherwise normal.  Retroflexed views revealed no abnormalities. The time to cecum = 4.1 Withdrawal time = 7.1   The scope was withdrawn and the procedure completed. COMPLICATIONS: There were no immediate complications.  ENDOSCOPIC IMPRESSION: 1.   Sessile polyp was found at the cecum; polypectomy  was performed with a cold snare 2.   The examination was otherwise normal - good prep  RECOMMENDATIONS: 1.  Timing of repeat colonoscopy will be determined by pathology findings. 2.  Likely 5 years  eSigned:  Gatha Mayer, MD, [facility] 09/09/2014 9:10 AM   cc: Dr. Pierre Bali,        Dr. Abigail Miyamoto - Radiology  The Patient

## 2014-09-09 NOTE — H&P (View-Only) (Signed)
08/26/2014 Diana Bird [id number] February 06, 1960   HISTORY OF PRESENT ILLNESS:  This is a 54 year old female who has CHF with an EF of 20% and is in need of a heart transplant; being evaluated at [facility].  Needed screening for colon cancer.  Virtual CT scan ordered by Dr. Haroldine Laws showed the following:  IMPRESSION: 1. Moderate colonic distension, with significant degradation primarily on prone images. 2. Probable 6 mm cecal polyp. C rads category C2. Intermediate polyp or indeterminate finding. Surveillance or colonoscopy recommended (evidence suggests that colonoscopy can delayed at least 3 years, subject to individual patient circumstances). 3. Sigmoid colon area of apparent soft tissue fullness is favored to be due to underdistention. If the patient undergoes follow-up colonoscopy, recommend attention to this area.  Due to these findings she was sent here to schedule colonoscopy.  Past Medical History  Diagnosis Date  . Chronic systolic heart failure   . Hypertension   . Anemia   . Pneumonia   . Sleep apnea    Past Surgical History  Procedure Laterality Date  . None    . Cardiac catheterization N/A 07/17/2014    Procedure: Right/Left Heart Cath and Coronary Angiography;  Surgeon: Jolaine Artist, MD;  Location: [facility];  Service: Cardiovascular;  Laterality: N/A;    reports that she has never smoked. She has never used smokeless tobacco. She reports that she does not drink alcohol or use illicit drugs. family history includes Breast cancer in her sister; Heart failure in her mother. There is no history of Colon cancer or Colon polyps. No Known Allergies    Outpatient Encounter Prescriptions as of 08/26/2014  Medication Sig  . acetaminophen (TYLENOL) 325 MG tablet Take 2 tablets (650 mg total) by mouth every 6 (six) hours as needed for mild pain or headache.  . Calcium Carb-Cholecalciferol (CALCIUM-VITAMIN D) 600-400 MG-UNIT TABS Take 1 tablet by mouth  daily.  . carvedilol (COREG) 6.25 MG tablet Take 1.5 tablets (9.375 mg total) by mouth 2 (two) times daily with a meal.  . digoxin (LANOXIN) 0.125 MG tablet Take 0.125 mg by mouth daily.  . ferrous sulfate 325 (65 FE) MG tablet Take 325 mg by mouth daily with breakfast.   . furosemide (LASIX) 40 MG tablet Take 2 tablets (80 mg total) by mouth 2 (two) times daily.  . ivabradine (CORLANOR) 5 MG TABS tablet Take 1 tablet (5 mg total) by mouth 2 (two) times daily with a meal.  . potassium chloride SA (K-DUR,KLOR-CON) 20 MEQ tablet Take 1 tablet (20 mEq total) by mouth 2 (two) times daily.  . sacubitril-valsartan (ENTRESTO) 49-51 MG Take 1 tablet by mouth 2 (two) times daily.  Marland Kitchen spironolactone (ALDACTONE) 25 MG tablet Take 1 tablet (25 mg total) by mouth daily.   No facility-administered encounter medications on file as of 08/26/2014.     REVIEW OF SYSTEMS  : All other systems reviewed and negative except where noted in the History of Present Illness.   PHYSICAL EXAM: BP 80/60 mmHg  Pulse 64  Ht 5\' 2"  (1.575 m)  Wt 108 lb (48.988 kg)  BMI 19.75 kg/m2 General: Well developed female in no acute distress Head: Normocephalic and atraumatic Eyes:  Sclerae anicteric, conjunctiva pink. Ears: Normal auditory acuity Lungs: Clear throughout to auscultation Heart: Regular rate and rhythm Abdomen: Soft, non-distended.  Normal bowel sounds.  Non-tender. Rectal:  Will be done at the time of colonoscopy. Musculoskeletal: Symmetrical with no gross deformities  Skin: No lesions  on visible extremities Extremities: No edema  Neurological: Alert oriented x 4, grossly non-focal Psychological:  Alert and cooperative. Normal mood and affect  ASSESSMENT AND PLAN: -Screening colonoscopy:  Abnormal virtual CT scan showing possible colon polyps.  Patient needs colonoscopy for possible heart transplant.  Will schedule.  The risks, benefits, and alternatives were discussed with the patient and she consents to  proceed.    CC:  Bensimhon, Shaune Pascal, MD

## 2014-09-09 NOTE — Discharge Instructions (Signed)
° °  I found and removed one small polyp (was seen on the CT) and it looks benign. I will let you know pathology results and when to have another routine colonoscopy by mail.  All else was normal.  I appreciate the opportunity to care for you. Gatha Mayer, MD, FACG  YOU HAD AN ENDOSCOPIC PROCEDURE TODAY: Refer to the procedure report and other information in the discharge instructions given to you for any specific questions about what was found during the examination. If this information does not answer your questions, please call Dr. Celesta Aver office at [phone] to clarify.   YOU SHOULD EXPECT: Some feelings of bloating in the abdomen. Passage of more gas than usual. Walking can help get rid of the air that was put into your GI tract during the procedure and reduce the bloating. If you had a lower endoscopy (such as a colonoscopy or flexible sigmoidoscopy) you may notice spotting of blood in your stool or on the toilet paper. Some abdominal soreness may be present for a day or two, also.  DIET: Your first meal following the procedure should be a light meal and then it is ok to progress to your normal diet. A half-sandwich or bowl of soup is an example of a good first meal. Heavy or fried foods are harder to digest and may make you feel nauseous or bloated. Drink plenty of fluids but you should avoid alcoholic beverages for 24 hours.   ACTIVITY: Your care partner should take you home directly after the procedure. You should plan to take it easy, moving slowly for the rest of the day. You can resume normal activity the day after the procedure however YOU SHOULD NOT DRIVE, use power tools, machinery or perform tasks that involve climbing or major physical exertion for 24 hours (because of the sedation medicines used during the test).   SYMPTOMS TO REPORT IMMEDIATELY: A gastroenterologist can be reached at any hour. Please call [phone]  for any of the following symptoms:  Following  lower endoscopy (colonoscopy, flexible sigmoidoscopy) Excessive amounts of blood in the stool  Significant tenderness, worsening of abdominal pains  Swelling of the abdomen that is new, acute  Fever of 100 or higher    FOLLOW UP:  If any biopsies were taken you will be contacted by phone or by letter within the next 1-3 weeks. Call [phone]  if you have not heard about the biopsies in 3 weeks.  Please also call with any specific questions about appointments or follow up tests.

## 2014-09-09 NOTE — Anesthesia Procedure Notes (Signed)
Date/Time: 09/09/2014 8:42 AM Performed by: Lowella Dell Pre-anesthesia Checklist: Patient identified, Emergency Drugs available, Suction available, Patient being monitored and Timeout performed Patient Re-evaluated:Patient Re-evaluated prior to inductionOxygen Delivery Method: Nasal cannula Intubation Type: IV induction Dental Injury: Teeth and Oropharynx as per pre-operative assessment

## 2014-09-09 NOTE — Anesthesia Postprocedure Evaluation (Signed)
  Anesthesia Post-op Note  Patient: Diana Bird  Procedure(s) Performed: Procedure(s): COLONOSCOPY WITH PROPOFOL (N/A)  Patient Location: Endoscopy Unit  Anesthesia Type:MAC  Level of Consciousness: awake, alert , oriented and patient cooperative  Airway and Oxygen Therapy: Patient Spontanous Breathing  Post-op Pain: none  Post-op Assessment: Post-op Vital signs reviewed, Patient's Cardiovascular Status Stable, Respiratory Function Stable, Patent Airway, No signs of Nausea or vomiting and Pain level controlled              Post-op Vital Signs: Reviewed and stable  Last Vitals:  Filed Vitals:   09/09/14 0920  BP: 116/47  Pulse: 38  Temp:   Resp: 15    Complications: No apparent anesthesia complications

## 2014-09-09 NOTE — Anesthesia Preprocedure Evaluation (Addendum)
Anesthesia Evaluation  Patient identified by MRN, date of birth, ID band Patient awake    Reviewed: Allergy & Precautions, NPO status , Patient's Chart, lab work & pertinent test results, reviewed documented beta blocker date and time   History of Anesthesia Complications Negative for: history of anesthetic complications  Airway Mallampati: II  TM Distance: >3 FB Neck ROM: Full    Dental  (+) Teeth Intact, Dental Advisory Given   Pulmonary shortness of breath, with exertion and lying,  breath sounds clear to auscultation        Cardiovascular hypertension, Pt. on medications and Pt. on home beta blockers +CHF - CAD Rhythm:Regular Rate:Normal  6/16 cath: Normal coronary arteries, Severe NICM with EF 15% by echo, Well compensated filling pressures with moderately reduced cardiac output 6/16 ECHO: EF 20%, mild MR    Neuro/Psych negative neurological ROS     GI/Hepatic negative GI ROS, Neg liver ROS,   Endo/Other  negative endocrine ROS  Renal/GU negative Renal ROS     Musculoskeletal   Abdominal   Peds  Hematology   Anesthesia Other Findings   Reproductive/Obstetrics                           Anesthesia Physical Anesthesia Plan  ASA: III  Anesthesia Plan: MAC   Post-op Pain Management:    Induction: Intravenous  Airway Management Planned: Natural Airway and Simple Face Mask  Additional Equipment:   Intra-op Plan:   Post-operative Plan:   Informed Consent: I have reviewed the patients History and Physical, chart, labs and discussed the procedure including the risks, benefits and alternatives for the proposed anesthesia with the patient or authorized representative who has indicated his/her understanding and acceptance.   Dental advisory given  Plan Discussed with: CRNA and Surgeon  Anesthesia Plan Comments: (Plan routine monitors, MAC)        Anesthesia Quick  Evaluation

## 2014-09-09 NOTE — Transfer of Care (Addendum)
Immediate Anesthesia Transfer of Care Note  Patient: Diana Bird  Procedure(s) Performed: Procedure(s): COLONOSCOPY WITH PROPOFOL (N/A)  Patient Location: PACU  Anesthesia Type:MAC  Level of Consciousness: awake, patient cooperative and lethargic  Airway & Oxygen Therapy: Patient Spontanous Breathing and Patient connected to nasal cannula oxygen  Post-op Assessment: Report given to RN, Post -op Vital signs reviewed and stable and Patient moving all extremities  Post vital signs: Reviewed and stable  Last Vitals:  Filed Vitals:   09/09/14 0749  BP: 108/70  Temp: 36.4 C  Resp: 16    Complications: No apparent anesthesia complications

## 2014-09-10 ENCOUNTER — Telehealth (HOSPITAL_COMMUNITY): Payer: Self-pay | Admitting: Infectious Diseases

## 2014-09-10 ENCOUNTER — Encounter (HOSPITAL_COMMUNITY): Payer: Self-pay | Admitting: Internal Medicine

## 2014-09-10 ENCOUNTER — Encounter: Payer: Self-pay | Admitting: Infectious Diseases

## 2014-09-10 NOTE — Telephone Encounter (Signed)
Contacted by [facility] Transplant Coordinator Loyce Dys requesting updated reports. Faxed to her the following at [phone].   Colonoscopy/Pathoogy EKG/Echo PFTs Vascular US Doppler LE venous ABI Oropantogram Labs 2016

## 2014-09-10 NOTE — Progress Notes (Unsigned)
Patient ID: Diana Bird, female   DOB: 11-Jul-1960, 54 y.o.   MRN: [id number]

## 2014-09-11 ENCOUNTER — Encounter (HOSPITAL_COMMUNITY): Payer: Self-pay

## 2014-09-11 NOTE — Progress Notes (Signed)
Medical report for Disability Eligility Review Form completed and signed by Dr. Glori Bickers.  Completed form along with all requested medical documents (Discharge summary, progress notes, lab results, diagnostic studies, referral notes, and upcoming appointment) return-faxed to [place] @ [phone] Attn: Shane Crutch, Benefits Manager per request. Copy of request form scanned into electronic medical records.  Renee Pain

## 2014-09-13 ENCOUNTER — Encounter (HOSPITAL_COMMUNITY): Payer: Self-pay

## 2014-09-13 NOTE — Progress Notes (Signed)
 [facility] [place] faxed med rec request for all records 07/2014-present. All notes, images, ekgs, labs, and diagnostic studies mailed to given address.  Renee Pain

## 2014-09-16 ENCOUNTER — Encounter: Payer: Self-pay | Admitting: Internal Medicine

## 2014-09-16 ENCOUNTER — Encounter (HOSPITAL_COMMUNITY): Payer: Self-pay | Admitting: Internal Medicine

## 2014-09-16 NOTE — Progress Notes (Signed)
Quick Note:  Adenoma Repeat colonoscopy 2021 ______

## 2014-09-17 ENCOUNTER — Encounter (HOSPITAL_COMMUNITY): Payer: BC Managed Care – PPO

## 2014-09-19 ENCOUNTER — Encounter (HOSPITAL_COMMUNITY): Payer: Self-pay

## 2014-09-19 ENCOUNTER — Ambulatory Visit (HOSPITAL_COMMUNITY): Admit: 2014-09-19 | Payer: Self-pay | Admitting: Gastroenterology

## 2014-09-19 SURGERY — COLONOSCOPY
Anesthesia: Monitor Anesthesia Care

## 2014-09-24 ENCOUNTER — Encounter (HOSPITAL_COMMUNITY): Payer: Self-pay | Admitting: *Deleted

## 2014-09-24 ENCOUNTER — Ambulatory Visit (HOSPITAL_COMMUNITY)
Admission: RE | Admit: 2014-09-24 | Discharge: 2014-09-24 | Disposition: A | Discharge status: Home / Self Care | Payer: BC Managed Care – PPO | Source: Ambulatory Visit | Attending: Cardiology | Admitting: Cardiology

## 2014-09-24 VITALS — BP 82/54 | HR 80 | Wt 109.0 lb

## 2014-09-24 DIAGNOSIS — Z8249 Family history of ischemic heart disease and other diseases of the circulatory system: Secondary | ICD-10-CM | POA: Diagnosis not present

## 2014-09-24 DIAGNOSIS — Z79899 Other long term (current) drug therapy: Secondary | ICD-10-CM | POA: Insufficient documentation

## 2014-09-24 DIAGNOSIS — I10 Essential (primary) hypertension: Secondary | ICD-10-CM | POA: Insufficient documentation

## 2014-09-24 DIAGNOSIS — I429 Cardiomyopathy, unspecified: Secondary | ICD-10-CM | POA: Insufficient documentation

## 2014-09-24 DIAGNOSIS — I5022 Chronic systolic (congestive) heart failure: Secondary | ICD-10-CM | POA: Diagnosis not present

## 2014-09-24 DIAGNOSIS — I5043 Acute on chronic combined systolic (congestive) and diastolic (congestive) heart failure: Secondary | ICD-10-CM | POA: Diagnosis present

## 2014-09-24 LAB — CBC
HEMATOCRIT: 44.8 % (ref 36.0–46.0)
Hemoglobin: 14.7 g/dL (ref 12.0–15.0)
MCH: 26.7 pg (ref 26.0–34.0)
MCHC: 32.8 g/dL (ref 30.0–36.0)
MCV: 81.5 fL (ref 78.0–100.0)
PLATELETS: 217 10*3/uL (ref 150–400)
RBC: 5.5 MIL/uL — AB (ref 3.87–5.11)
RDW: 25.1 % — AB (ref 11.5–15.5)
WBC: 3.3 10*3/uL — ABNORMAL LOW (ref 4.0–10.5)

## 2014-09-24 LAB — PROTIME-INR
INR: 1.02 (ref 0.00–1.49)
Prothrombin Time: 13.6 seconds (ref 11.6–15.2)

## 2014-09-24 LAB — BASIC METABOLIC PANEL
Anion gap: 7 (ref 5–15)
BUN: 12 mg/dL (ref 6–20)
CO2: 31 mmol/L (ref 22–32)
Calcium: 9.7 mg/dL (ref 8.9–10.3)
Chloride: 102 mmol/L (ref 101–111)
Creatinine, Ser: 0.91 mg/dL (ref 0.44–1.00)
GFR calc Af Amer: >60 mL/min (ref >60)
Glucose, Bld: 73 mg/dL (ref 65–99)
Potassium: 3.9 mmol/L (ref 3.5–5.1)
SODIUM: 140 mmol/L (ref 135–145)

## 2014-09-24 MED ORDER — FUROSEMIDE 40 MG PO TABS: 80.0000 mg | ORAL_TABLET | Freq: Every day | ORAL | Status: DC

## 2014-09-24 MED ORDER — IVABRADINE HCL 5 MG PO TABS: 7.5000 mg | ORAL_TABLET | Freq: Two times a day (BID) | ORAL | Status: DC

## 2014-09-24 MED ORDER — POTASSIUM CHLORIDE CRYS ER 20 MEQ PO TBCR: 20.0000 meq | EXTENDED_RELEASE_TABLET | Freq: Every day | ORAL | Status: AC

## 2014-09-24 NOTE — Progress Notes (Signed)
Patient ID: Diana Bird, female   DOB: 1961-01-04, 54 y.o.   MRN: [id number] Primary Cardiologist: Turner/ Tawan Degroote  HPI: Diana Bird  Is a 54 y/o woman with h/o systolic HF due to NICM dating back to 2007. She was admitted from the HF clinic on 07/12/14 with decompensated HF. Prior to this she had not been seen in the Clinic in approx 2 years. She was started on milrinone 6/11 due to sluggish diuresis and co-ox 44%. She was started on entresto and spiro on 07/15/14. Her co-ox gradually improved and was measured at 70% on 07/17/14 after milrinone wean.   During that admission, R and L heart catherization was performed on 07/17/14 and she was noted to have normal coronaries with severe NICM with EF 15% by echo, and well-compensated filling pressures with moderately reduced CO.  Echo:  (07/13/14); LVEF 15-20% RV moderately HK Mild MR  LHC/RHC 07/17/14 Normal coronaries RA = 3 RV = 27/2/3 PA = 29/13 (21) PCW = 5 Fick cardiac output/index = 3.3/2.3 PVR = 4.8 WU SVR = 2116 FA sat = 99% PA sat = 70%, 71% Ao Pressure: 109/67 (83) LV Pressure: 106/3/9  While in hospital had a long discussion about advanced therapiesincluding home inotropes, LVAD, and possible transplant. Pt was seen by VAD team including Dr. Prescott Gum and thought to be a candidate for LVAD as bridging therapy. Likely Heartware as opposed to Heartmate II due to patient size.   She had a large portion of her VAD screening tests in the hospital including PFTs, CT C/A/P, pre-VAD dopplers, and cMRI which were all re-assuring for her candicacy.  She will also need to be evaluated for Heart transplant at [facility]. She is aware that the journey up to a VAD is a difficult process, and has been counseled on the importance of a "teammate". She has mentioned the possibility of this being one of her children or her sister. She has also been counseled on the importance of the HF team in managing a VAD post-placement, and what types  of difficulties to expect adjusting to this.   Diana Bird returns for routine followup today.  She feels much better. Undergoing transplant w/u at [facility] and has testing the week of sept 6-9. She recently had colonoscopy with adenoma without high-grade dysplasia. Able to do all ADLs without a problem. Can go to [employer] without too much difficulty. Gets SOB if she goes to fast or too far. No edema, orthopnea, no lightheadedness, no chest pain. Weighing every day. No problems with medicines  CPX (6/16): peak VO2 14.1, VE/VCO2 slope 38.1 => moderate to severe cardiac functional limitation.   Labs 6/16:  K 4.0 => 4.2, Cr 0.74 => 0.91, Mg 1.9, BNP 1686  ROS: All systems negative except as listed in HPI, PMH and Problem List.  SH:  Social History   Social History  . Marital Status: Married    Spouse Name: N/A  . Number of Children: N/A  . Years of Education: N/A   Occupational History  . Not on file.   Social History Main Topics  . Smoking status: Never Smoker   . Smokeless tobacco: Never Used  . Alcohol Use: No     Comment: occasional - maybe   a glass a month.  . Drug Use: No  . Sexual Activity: Not on file   Other Topics Concern  . Not on file   Social History Narrative   Works full time doing clerical work.  FH:  Family History  Problem Relation Age of Onset  . Heart failure Mother   . Breast cancer Sister   . Colon cancer Neg Hx   . Colon polyps Neg Hx     Past Medical History  Diagnosis Date  . Chronic systolic heart failure   . Hypertension   . Anemia   . Pneumonia   . CHF (congestive heart failure)   . Shortness of breath dyspnea     at rest at  times  . 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 Hx of adenomatous polyp of colon 09/09/2014    Current Outpatient Prescriptions  Medication Sig Dispense Refill  . acetaminophen (TYLENOL) 325 MG tablet Take 2 tablets (650 mg total) by mouth every 6 (six) hours as needed for mild pain or headache.    .  Calcium Carb-Cholecalciferol (CALCIUM-VITAMIN D) 600-400 MG-UNIT TABS Take 1 tablet by mouth daily.    . carvedilol (COREG) 6.25 MG tablet Take 1.5 tablets (9.375 mg total) by mouth 2 (two) times daily with a meal. 90 tablet 6  . digoxin (LANOXIN) 0.125 MG tablet Take 0.125 mg by mouth daily.    . ferrous sulfate 325 (65 FE) MG tablet Take 325 mg by mouth daily with breakfast.     . furosemide (LASIX) 40 MG tablet Take 2 tablets (80 mg total) by mouth 2 (two) times daily. 120 tablet 1  . ivabradine (CORLANOR) 5 MG TABS tablet Take 1 tablet (5 mg total) by mouth 2 (two) times daily with a meal. 60 tablet 3  . potassium chloride SA (K-DUR,KLOR-CON) 20 MEQ tablet Take 1 tablet (20 mEq total) by mouth 2 (two) times daily. 60 tablet 6  . sacubitril-valsartan (ENTRESTO) 49-51 MG Take 1 tablet by mouth 2 (two) times daily. 60 tablet 6  . spironolactone (ALDACTONE) 25 MG tablet Take 1 tablet (25 mg total) by mouth daily. 30 tablet 6   No current facility-administered medications for this encounter.    Filed Vitals:   09/24/14 0903  BP: 82/54  Pulse: 80  Weight: 109 lb (49.442 kg)  SpO2: 100%    PHYSICAL EXAM: Doppler SBP 86  General:  Well appearing. No resp difficulty HEENT: normal Neck: supple. JVP flat Carotids 2+ bilaterally; no bruits. No lymphadenopathy or thryomegaly appreciated. Cor: PMI laterally displaced. Regular rate & rhythm. No s3. No murmur. Lungs: clear Abdomen: soft, nontender, nondistended. No hepatosplenomegaly. No bruits or masses. Good bowel sounds. Extremities: no cyanosis, clubbing, rash, edema Neuro: alert & orientedx3, cranial nerves grossly intact. Moves all 4 extremities w/o difficulty. Affect pleasant.  ASSESSMENT & PLAN: 1. Chronic systolic HF: Nonischemic cardiomyopathy.  LV EF 15-20% with moderate RV dysfunction by echo. Cath 6/16 normal coronaries.   CPX with moderate to severe cardiac functional limitation.  Symptomatically, she is improved.  NYHA class III  symptoms. She is not volume overloaded on exam. Blood Type O+ undergoing transplant w/u at [place] current Coreg and Entresto.  BP is a bit soft at this time for titration.  - Continue digoxin and spironolactone.   - Decrease lasix to 80 daily with kcl 20 daily. Take extra lasix as needed.  - Given elevated HR will increase Corlanor to 7.5 bid - She has appt at [facility] transplant clinic. Given degree of RV dysfunction, I would not want to wait much longer to proceed with VAD if necessary.  She understands that we may need to proceed with LVAD, especially if she has any clinical worsening.  -  Will schedule RHC and mammogram in next week.  - I have arranged for her to meet ms. Wyvonnia Dusky one of our Heart Transplant patients for support.  - Labs today  Raiza Kiesel,MD 10:51 AM

## 2014-09-24 NOTE — Patient Instructions (Signed)
Increase Corlanor to 7.5 mg (1 and 1/2 tabs) Twice daily   Decrease Furosemide (Lasix) to 80 mg daily, can take extra as needed  Decrease Potassium to 20 meq daily, if you take extra Lasix take an extra Potassium  Labs today  Heart Catheterization on Wed 8/31, see instruction sheet  Your physician recommends that you schedule a follow-up appointment in: 4 weeks

## 2014-09-25 ENCOUNTER — Other Ambulatory Visit: Payer: Self-pay | Admitting: *Deleted

## 2014-09-27 ENCOUNTER — Telehealth (HOSPITAL_COMMUNITY): Payer: Self-pay | Admitting: Infectious Diseases

## 2014-09-27 NOTE — Telephone Encounter (Signed)
Called regarding her upcoming [facility] transplant evaluation schedule. D/W Dr. Haroldine Laws at recent clinic visit that she will have her [place] and mammogram done locally to assist with driving burden. Hope scheduled but is inquiring about mammogram. Order for bilateral screening mammogram was placed and I provided her with [facility] contact information [phone] so she may schedule to fit her needs.   She called back to confirm that she will have mammogram done on Tuesday August 30th at 12:00. Will send email communication to her transplant coordinator Loyce Dys to update.

## 2014-09-30 ENCOUNTER — Telehealth (HOSPITAL_COMMUNITY): Payer: Self-pay | Admitting: Cardiology

## 2014-09-30 NOTE — Telephone Encounter (Signed)
Patient is scheduled for RHC on 10/02/2014 with Dr.Bensimhon Cpt [phone] With patients current insurance-[employer]

## 2014-10-01 ENCOUNTER — Ambulatory Visit
Admission: RE | Admit: 2014-10-01 | Discharge: 2014-10-01 | Disposition: A | Discharge status: Home / Self Care | Payer: BC Managed Care – PPO | Source: Ambulatory Visit | Attending: Internal Medicine | Admitting: Internal Medicine

## 2014-10-01 DIAGNOSIS — Z1231 Encounter for screening mammogram for malignant neoplasm of breast: Secondary | ICD-10-CM

## 2014-10-02 ENCOUNTER — Encounter (HOSPITAL_COMMUNITY)
Admission: RE | Disposition: A | Discharge status: Home / Self Care | Payer: BC Managed Care – PPO | Source: Ambulatory Visit | Attending: Internal Medicine

## 2014-10-02 ENCOUNTER — Ambulatory Visit (HOSPITAL_COMMUNITY)
Admission: RE | Admit: 2014-10-02 | Discharge: 2014-10-02 | Disposition: A | Discharge status: Home / Self Care | Payer: BC Managed Care – PPO | Source: Ambulatory Visit | Attending: Internal Medicine | Admitting: Internal Medicine

## 2014-10-02 ENCOUNTER — Encounter (HOSPITAL_COMMUNITY): Payer: Self-pay | Admitting: Internal Medicine

## 2014-10-02 DIAGNOSIS — I428 Other cardiomyopathies: Secondary | ICD-10-CM | POA: Insufficient documentation

## 2014-10-02 DIAGNOSIS — I5022 Chronic systolic (congestive) heart failure: Secondary | ICD-10-CM | POA: Diagnosis not present

## 2014-10-02 DIAGNOSIS — I272 Other secondary pulmonary hypertension: Secondary | ICD-10-CM | POA: Insufficient documentation

## 2014-10-02 DIAGNOSIS — I10 Essential (primary) hypertension: Secondary | ICD-10-CM | POA: Diagnosis not present

## 2014-10-02 DIAGNOSIS — Z79899 Other long term (current) drug therapy: Secondary | ICD-10-CM | POA: Insufficient documentation

## 2014-10-02 HISTORY — PX: CARDIAC CATHETERIZATION: SHX172

## 2014-10-02 LAB — POCT I-STAT 3, VENOUS BLOOD GAS (G3P V)
Acid-Base Excess: 1 mmol/L (ref 0.0–2.0)
Acid-Base Excess: 1 mmol/L (ref 0.0–2.0)
Bicarbonate: 26 meq/L — ABNORMAL HIGH (ref 20.0–24.0)
Bicarbonate: 26.6 meq/L — ABNORMAL HIGH (ref 20.0–24.0)
Bicarbonate: 27.4 meq/L — ABNORMAL HIGH (ref 20.0–24.0)
O2 Saturation: 69 %
O2 Saturation: 70 %
O2 Saturation: 73 %
TCO2: 27 mmol/L (ref 0–100)
TCO2: 28 mmol/L (ref 0–100)
TCO2: 29 mmol/L (ref 0–100)
pCO2, Ven: 46 mmHg (ref 45.0–50.0)
pCO2, Ven: 46.2 mmHg (ref 45.0–50.0)
pCO2, Ven: 47.5 mmHg (ref 45.0–50.0)
pH, Ven: 7.36 — ABNORMAL HIGH (ref 7.250–7.300)
pH, Ven: 7.369 — ABNORMAL HIGH (ref 7.250–7.300)
pH, Ven: 7.369 — ABNORMAL HIGH (ref 7.250–7.300)
pO2, Ven: 37 mmHg (ref 30.0–45.0)
pO2, Ven: 38 mmHg (ref 30.0–45.0)
pO2, Ven: 40 mmHg (ref 30.0–45.0)

## 2014-10-02 SURGERY — RIGHT HEART CATH

## 2014-10-02 MED ORDER — MIDAZOLAM HCL 2 MG/2ML IJ SOLN
INTRAMUSCULAR | Status: AC
  Filled 2014-10-02: qty 4

## 2014-10-02 MED ORDER — SODIUM CHLORIDE 0.9 % IJ SOLN: 3.0000 mL | Freq: Two times a day (BID) | INTRAMUSCULAR | Status: DC

## 2014-10-02 MED ORDER — ACETAMINOPHEN 325 MG PO TABS: 650.0000 mg | ORAL_TABLET | ORAL | Status: DC | PRN

## 2014-10-02 MED ORDER — SODIUM CHLORIDE 0.9 % IV SOLN: INTRAVENOUS | Status: DC

## 2014-10-02 MED ORDER — ONDANSETRON HCL 4 MG/2ML IJ SOLN: 4.0000 mg | Freq: Four times a day (QID) | INTRAMUSCULAR | Status: DC | PRN

## 2014-10-02 MED ORDER — LIDOCAINE HCL (PF) 1 % IJ SOLN
INTRAMUSCULAR | Status: AC
  Filled 2014-10-02: qty 30

## 2014-10-02 MED ORDER — HEPARIN (PORCINE) IN NACL 2-0.9 UNIT/ML-% IJ SOLN
INTRAMUSCULAR | Status: AC
  Filled 2014-10-02: qty 1000

## 2014-10-02 MED ORDER — MIDAZOLAM HCL 2 MG/2ML IJ SOLN
INTRAMUSCULAR | Status: DC | PRN
  Administered 2014-10-02: 1 mg via INTRAVENOUS

## 2014-10-02 MED ORDER — SODIUM CHLORIDE 0.9 % IJ SOLN: 3.0000 mL | INTRAMUSCULAR | Status: DC | PRN

## 2014-10-02 MED ORDER — SODIUM CHLORIDE 0.9 % IV SOLN: 250.0000 mL | INTRAVENOUS | Status: DC | PRN

## 2014-10-02 MED ORDER — ASPIRIN 81 MG PO CHEW: 81.0000 mg | CHEWABLE_TABLET | ORAL | Status: DC

## 2014-10-02 SURGICAL SUPPLY — 5 items
CATH BALLN WEDGE 5F 110CM (CATHETERS) ×3 IMPLANT
KIT HEART LEFT (KITS) ×3 IMPLANT
PACK CARDIAC CATHETERIZATION (CUSTOM PROCEDURE TRAY) ×3 IMPLANT
SHEATH FAST CATH BRACH 5F 5CM (SHEATH) ×3 IMPLANT
TRANSDUCER W/STOPCOCK (MISCELLANEOUS) ×3 IMPLANT

## 2014-10-02 NOTE — Progress Notes (Signed)
Sheath Pull: Diana Bird brachial sheath pulled at 0930 and manual pressure was held for 8 mins by Tamala Julian. Site remains WNL and was covered with gauze and a tegaderm. Vitals remained WNL. No complications noted. Pt will now be transported to [facility] room 10.

## 2014-10-02 NOTE — Interval H&P Note (Signed)
History and Physical Interval Note:  10/02/2014 9:24 AM  Diana Bird  has presented today for surgery, with the diagnosis of hf  The various methods of treatment have been discussed with the patient and family. After consideration of risks, benefits and other options for treatment, the patient has consented to  Procedure(s): Right Heart Cath (N/A) as a surgical intervention .  The patient's history has been reviewed, patient examined, no change in status, stable for surgery.  I have reviewed the patient's chart and labs.  Questions were answered to the patient's satisfaction.     Edoardo Laforte, Quillian Quince

## 2014-10-02 NOTE — Discharge Instructions (Signed)
Venogram, Care After °Refer to this sheet in the next few weeks. These instructions provide you with information on caring for yourself after your procedure. Your health care provider may also give you more specific instructions. Your treatment has been planned according to current medical practices, but problems sometimes occur. Call your health care provider if you have any problems or questions after your procedure. °WHAT TO EXPECT AFTER THE PROCEDURE °After your procedure, it is typical to have the following sensations: °· Mild discomfort at the catheter insertion site. °HOME CARE INSTRUCTIONS  °· Take all medicines exactly as directed. °· Follow any prescribed diet. °· Follow instructions regarding both rest and physical activity. °· Drink more fluids for the first several days after the procedure in order to help flush dye from your kidneys. °SEEK MEDICAL CARE IF: °· You develop a rash. °· You have fever not controlled by medicine. °SEEK IMMEDIATE MEDICAL CARE IF: °· There is pain, drainage, bleeding, redness, swelling, warmth or a red streak at the site of the IV tube. °· The extremity where your IV tube was placed becomes discolored, numb, or cool. °· You have difficulty breathing or shortness of breath. °· You develop chest pain. °· You have excessive dizziness or fainting. °Document Released: 11/08/2012 Document Revised: 01/23/2013 Document Reviewed: 11/08/2012 °ExitCare® Patient Information ©2015 [employer]. This information is not intended to replace advice given to you by your health care provider. Make sure you discuss any questions you have with your health care provider. ° °

## 2014-10-02 NOTE — H&P (View-Only) (Signed)
Patient ID: Diana Bird, female   DOB: 03/01/1960, 53 y.o.   MRN: [id number] Primary Cardiologist: Turner/ Bensimhon  HPI: Diana Bird  Is a 53 y/o woman with h/o systolic HF due to NICM dating back to 2007. She was admitted from the HF clinic on 07/12/14 with decompensated HF. Prior to this she had not been seen in the Clinic in approx 2 years. She was started on milrinone 6/11 due to sluggish diuresis and co-ox 44%. She was started on entresto and spiro on 07/15/14. Her co-ox gradually improved and was measured at 70% on 07/17/14 after milrinone wean.   During that admission, R and L heart catherization was performed on 07/17/14 and she was noted to have normal coronaries with severe NICM with EF 15% by echo, and well-compensated filling pressures with moderately reduced CO.  Echo:  (07/13/14); LVEF 15-20% RV moderately HK Mild MR  LHC/RHC 07/17/14 Normal coronaries RA = 3 RV = 27/2/3 PA = 29/13 (21) PCW = 5 Fick cardiac output/index = 3.3/2.3 PVR = 4.8 WU SVR = 2116 FA sat = 99% PA sat = 70%, 71% Ao Pressure: 109/67 (83) LV Pressure: 106/3/9  While in hospital had a long discussion about advanced therapiesincluding home inotropes, LVAD, and possible transplant. Pt was seen by VAD team including Dr. Van Trigt and thought to be a candidate for LVAD as bridging therapy. Likely Heartware as opposed to Heartmate II due to patient size.   She had a large portion of her VAD screening tests in the hospital including PFTs, CT C/A/P, pre-VAD dopplers, and cMRI which were all re-assuring for her candicacy.  She will also need to be evaluated for Heart transplant at [facility]. She is aware that the journey up to a VAD is a difficult process, and has been counseled on the importance of a "teammate". She has mentioned the possibility of this being one of her children or her sister. She has also been counseled on the importance of the HF team in managing a VAD post-placement, and what types  of difficulties to expect adjusting to this.   Diana Bird returns for routine followup today.  She feels much better. Undergoing transplant w/u at [facility] and has testing the week of sept 6-9. She recently had colonoscopy with adenoma without high-grade dysplasia. Able to do all ADLs without a problem. Can go to [employer] without too much difficulty. Gets SOB if she goes to fast or too far. No edema, orthopnea, no lightheadedness, no chest pain. Weighing every day. No problems with medicines  CPX (6/16): peak VO2 14.1, VE/VCO2 slope 38.1 => moderate to severe cardiac functional limitation.   Labs 6/16:  K 4.0 => 4.2, Cr 0.74 => 0.91, Mg 1.9, BNP 1686  ROS: All systems negative except as listed in HPI, PMH and Problem List.  SH:  Social History   Social History  . Marital Status: Married    Spouse Name: N/A  . Number of Children: N/A  . Years of Education: N/A   Occupational History  . Not on file.   Social History Main Topics  . Smoking status: Never Smoker   . Smokeless tobacco: Never Used  . Alcohol Use: No     Comment: occasional - maybe   a glass a month.  . Drug Use: No  . Sexual Activity: Not on file   Other Topics Concern  . Not on file   Social History Narrative   Works full time doing clerical work.       FH:  Family History  Problem Relation Age of Onset  . Heart failure Mother   . Breast cancer Sister   . Colon cancer Neg Hx   . Colon polyps Neg Hx     Past Medical History  Diagnosis Date  . Chronic systolic heart failure   . Hypertension   . Anemia   . Pneumonia   . CHF (congestive heart failure)   . Shortness of breath dyspnea     at rest at  times  . 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 Hx of adenomatous polyp of colon 09/09/2014    Current Outpatient Prescriptions  Medication Sig Dispense Refill  . acetaminophen (TYLENOL) 325 MG tablet Take 2 tablets (650 mg total) by mouth every 6 (six) hours as needed for mild pain or headache.    .  Calcium Carb-Cholecalciferol (CALCIUM-VITAMIN D) 600-400 MG-UNIT TABS Take 1 tablet by mouth daily.    . carvedilol (COREG) 6.25 MG tablet Take 1.5 tablets (9.375 mg total) by mouth 2 (two) times daily with a meal. 90 tablet 6  . digoxin (LANOXIN) 0.125 MG tablet Take 0.125 mg by mouth daily.    . ferrous sulfate 325 (65 FE) MG tablet Take 325 mg by mouth daily with breakfast.     . furosemide (LASIX) 40 MG tablet Take 2 tablets (80 mg total) by mouth 2 (two) times daily. 120 tablet 1  . ivabradine (CORLANOR) 5 MG TABS tablet Take 1 tablet (5 mg total) by mouth 2 (two) times daily with a meal. 60 tablet 3  . potassium chloride SA (K-DUR,KLOR-CON) 20 MEQ tablet Take 1 tablet (20 mEq total) by mouth 2 (two) times daily. 60 tablet 6  . sacubitril-valsartan (ENTRESTO) 49-51 MG Take 1 tablet by mouth 2 (two) times daily. 60 tablet 6  . spironolactone (ALDACTONE) 25 MG tablet Take 1 tablet (25 mg total) by mouth daily. 30 tablet 6   No current facility-administered medications for this encounter.    Filed Vitals:   09/24/14 0903  BP: 82/54  Pulse: 80  Weight: 109 lb (49.442 kg)  SpO2: 100%    PHYSICAL EXAM: Doppler SBP 86  General:  Well appearing. No resp difficulty HEENT: normal Neck: supple. JVP flat Carotids 2+ bilaterally; no bruits. No lymphadenopathy or thryomegaly appreciated. Cor: PMI laterally displaced. Regular rate & rhythm. No s3. No murmur. Lungs: clear Abdomen: soft, nontender, nondistended. No hepatosplenomegaly. No bruits or masses. Good bowel sounds. Extremities: no cyanosis, clubbing, rash, edema Neuro: alert & orientedx3, cranial nerves grossly intact. Moves all 4 extremities w/o difficulty. Affect pleasant.  ASSESSMENT & PLAN: 1. Chronic systolic HF: Nonischemic cardiomyopathy.  LV EF 15-20% with moderate RV dysfunction by echo. Cath 6/16 normal coronaries.   CPX with moderate to severe cardiac functional limitation.  Symptomatically, she is improved.  NYHA class III  symptoms. She is not volume overloaded on exam. Blood Type O+ undergoing transplant w/u at [facility] - Continue current Coreg and Entresto.  BP is a bit soft at this time for titration.  - Continue digoxin and spironolactone.   - Decrease lasix to 80 daily with kcl 20 daily. Take extra lasix as needed.  - Given elevated HR will increase Corlanor to 7.5 bid - She has appt at [facility] transplant clinic. Given degree of RV dysfunction, I would not want to wait much longer to proceed with VAD if necessary.  She understands that we may need to proceed with LVAD, especially if she has any clinical worsening.  -   Will schedule RHC and mammogram in next week.  - I have arranged for her to meet ms. Miranda Bridges one of our Heart Transplant patients for support.  - Labs today  Bensimhon, Daniel,MD 10:51 AM       

## 2014-10-03 ENCOUNTER — Other Ambulatory Visit: Payer: Self-pay | Admitting: Internal Medicine

## 2014-10-03 DIAGNOSIS — R928 Other abnormal and inconclusive findings on diagnostic imaging of breast: Secondary | ICD-10-CM

## 2014-10-03 MED FILL — Heparin Sodium (Porcine) 2 Unit/ML in Sodium Chloride 0.9%: INTRAMUSCULAR | Qty: 500 | Status: AC

## 2014-10-03 MED FILL — Lidocaine HCl Local Preservative Free (PF) Inj 1%: INTRAMUSCULAR | Qty: 30 | Status: AC

## 2014-10-04 ENCOUNTER — Telehealth (HOSPITAL_COMMUNITY): Payer: Self-pay | Admitting: Infectious Diseases

## 2014-10-04 ENCOUNTER — Encounter (HOSPITAL_COMMUNITY): Payer: Self-pay | Admitting: Infectious Diseases

## 2014-10-04 NOTE — Telephone Encounter (Signed)
Call received from a very tearful Diana Bird. She received a call from [place] and breast center requesting her to come back for further evaluation after screening mammogram performed. She was wondering if [place] called [place] to explain--I told her they have not since it was her test results and not ours and it sounds like they just need more information. She is very frustrated and upset because she had to go back for colonoscopy when the virtual revealed polyps and now she needs to go back for another mammogram too. She cannot understand why we can't do what we need to do from the beginning. I tried to explain to her that we do screening and least invasive procedures first because we do not want to do unnecessary procedures and possible harm to people unless the benefits out weigh the risks. She was still tearful and frustrated but ended the phone call.

## 2014-10-04 NOTE — Progress Notes (Signed)
Screening mammogram report and RHC report faxed to Loyce Dys at [facility] for Ms. Aitken's ongoing heart transplant evaluation.

## 2014-10-08 ENCOUNTER — Telehealth: Payer: Self-pay | Admitting: Licensed Clinical Social Worker

## 2014-10-08 NOTE — Telephone Encounter (Signed)
CSW contacted patient per request to offer support. Patient states she is at [facility] for transplant evaluation. Patient will return call to CSW tomorrow. Diana Bird, [place]

## 2014-10-09 ENCOUNTER — Telehealth (HOSPITAL_COMMUNITY): Payer: Self-pay | Admitting: *Deleted

## 2014-10-09 MED ORDER — SILDENAFIL CITRATE 20 MG PO TABS: 20.0000 mg | ORAL_TABLET | Freq: Three times a day (TID) | ORAL | Status: DC

## 2014-10-09 NOTE — Telephone Encounter (Signed)
Per Dr Haroldine Laws Referral form for Sildenafil 20 mg tid send to [facility] on 9/6, completed PA for med today and it was approved 09/09/14-10/09/15

## 2014-10-10 ENCOUNTER — Telehealth (HOSPITAL_COMMUNITY): Payer: Self-pay | Admitting: Infectious Diseases

## 2014-10-10 NOTE — Telephone Encounter (Signed)
Call received from [facility] heart Transplant requesting Diana Bird's colonoscopy/path and mammogram report. Sent to fax [phone]. Diagnostic mammogram scheduled tomorrow 10/11/14.

## 2014-10-11 ENCOUNTER — Ambulatory Visit
Admission: RE | Admit: 2014-10-11 | Discharge: 2014-10-11 | Disposition: A | Discharge status: Home / Self Care | Payer: BC Managed Care – PPO | Source: Ambulatory Visit | Attending: Internal Medicine | Admitting: Internal Medicine

## 2014-10-11 ENCOUNTER — Other Ambulatory Visit: Payer: Self-pay | Admitting: Internal Medicine

## 2014-10-11 ENCOUNTER — Encounter: Payer: Self-pay | Admitting: Infectious Diseases

## 2014-10-11 DIAGNOSIS — R928 Other abnormal and inconclusive findings on diagnostic imaging of breast: Secondary | ICD-10-CM

## 2014-10-11 NOTE — Progress Notes (Deleted)
Patient ID: Diana Bird, female   DOB: 12/17/1960, 53 y.o.   MRN: [id number]  

## 2014-10-11 NOTE — Telephone Encounter (Signed)
A user error has taken place: encounter opened in error, closed for administrative reasons.

## 2014-10-11 NOTE — Progress Notes (Signed)
Faxed completed disability paperwork to Shane Crutch at [facility] per patient's request.   Faxed diagnostic mammogram results to [facility] Heart Transplant Coordinator [place].

## 2014-10-18 DIAGNOSIS — Z0271 Encounter for disability determination: Secondary | ICD-10-CM

## 2014-10-23 ENCOUNTER — Other Ambulatory Visit (HOSPITAL_COMMUNITY): Payer: Self-pay | Admitting: *Deleted

## 2014-10-23 MED ORDER — SACUBITRIL-VALSARTAN 49-51 MG PO TABS: 1.0000 | ORAL_TABLET | Freq: Two times a day (BID) | ORAL | Status: DC

## 2014-10-25 ENCOUNTER — Telehealth (HOSPITAL_COMMUNITY): Payer: Self-pay | Admitting: Pharmacist

## 2014-10-25 NOTE — Telephone Encounter (Signed)
Diana Bird was approved by [employer] through 10/25/2015. [facility] pharmacy was notified of approval and copay will be $64/month.   Ruta Hinds. Velva Harman, PharmD, BCPS, CPP Clinical Pharmacist Pager: [phone] Phone: [phone] 10/25/2014 3:46 PM

## 2014-10-29 ENCOUNTER — Ambulatory Visit (HOSPITAL_COMMUNITY)
Admission: RE | Admit: 2014-10-29 | Discharge: 2014-10-29 | Disposition: A | Discharge status: Home / Self Care | Payer: BC Managed Care – PPO | Source: Ambulatory Visit | Attending: Internal Medicine | Admitting: Internal Medicine

## 2014-10-29 ENCOUNTER — Encounter (HOSPITAL_COMMUNITY): Payer: Self-pay

## 2014-10-29 VITALS — BP 102/70 | HR 61 | Wt 109.8 lb

## 2014-10-29 DIAGNOSIS — Z8249 Family history of ischemic heart disease and other diseases of the circulatory system: Secondary | ICD-10-CM | POA: Diagnosis not present

## 2014-10-29 DIAGNOSIS — I5022 Chronic systolic (congestive) heart failure: Secondary | ICD-10-CM | POA: Insufficient documentation

## 2014-10-29 DIAGNOSIS — I428 Other cardiomyopathies: Secondary | ICD-10-CM | POA: Insufficient documentation

## 2014-10-29 DIAGNOSIS — Z79899 Other long term (current) drug therapy: Secondary | ICD-10-CM | POA: Insufficient documentation

## 2014-10-29 DIAGNOSIS — I10 Essential (primary) hypertension: Secondary | ICD-10-CM | POA: Diagnosis not present

## 2014-10-29 LAB — BASIC METABOLIC PANEL
Anion gap: 8 (ref 5–15)
BUN: 14 mg/dL (ref 6–20)
CALCIUM: 9.9 mg/dL (ref 8.9–10.3)
CO2: 29 mmol/L (ref 22–32)
CREATININE: 0.94 mg/dL (ref 0.44–1.00)
Chloride: 100 mmol/L — ABNORMAL LOW (ref 101–111)
GFR calc Af Amer: >60 mL/min (ref >60)
GLUCOSE: 102 mg/dL — AB (ref 65–99)
POTASSIUM: 4.1 mmol/L (ref 3.5–5.1)
Sodium: 137 mmol/L (ref 135–145)

## 2014-10-29 LAB — DIGOXIN LEVEL: Digoxin Level: 0.5 ng/mL — ABNORMAL LOW (ref 0.8–2.0)

## 2014-10-29 NOTE — Patient Instructions (Signed)
Labs today  Your physician recommends that you schedule a follow-up appointment in: 4 weeks  Do the following things EVERYDAY: 1) Weigh yourself in the morning before breakfast. Write it down and keep it in a log. 2) Take your medicines as prescribed 3) Eat low salt foods-Limit salt (sodium) to 2000 mg per day.  4) Stay as active as you can everyday 5) Limit all fluids for the day to less than 2 liters 6)

## 2014-10-29 NOTE — Addendum Note (Signed)
Encounter addended by: Kerry Dory, CMA on: 10/29/2014 10:11 AM<BR>     Documentation filed: [employer], Patient Instructions Section, Orders

## 2014-10-29 NOTE — Progress Notes (Signed)
Advanced Heart Failure Medication Review by a Pharmacist  Does the patient  feel that his/her medications are working for him/her?  yes  Has the patient been experiencing any side effects to the medications prescribed?  no  Does the patient measure his/her own blood pressure or blood glucose at home?  no   Does the patient have any problems obtaining medications due to transportation or finances?   yes  Understanding of regimen: good Understanding of indications: good Potential of compliance: good    Pharmacist comments:  Ms. Humphres is a 54 yo F presenting without a medication list. She stated that she has never heard of Revatio before but knows she was supposed to start taking a new medication. The medication is approved and she was told that she needs to answer the phone when [facility] calls (they have called several times in the past week) to set up her deliveries. She also stated that [facility] did not have her Entresto ready and I called and verified that it was. She was resistant to further conversation with me about her medications.   Ruta Hinds. Velva Harman, PharmD, BCPS, CPP Clinical Pharmacist Pager: [phone] Phone: [phone] 10/29/2014 9:33 AM

## 2014-10-29 NOTE — Progress Notes (Signed)
ADVANCED HF CLINIC NOTE  Patient ID: Diana Bird, female   DOB: 09-02-1960, 54 y.o.   MRN: [id number] Primary Cardiologist: Turner/ Bensimhon  HPI: Diana Bird is a 54 y/o woman with h/o systolic HF due to NICM dating back to 2007. She was admitted from the HF clinic on 07/12/14 with decompensated HF. Prior to this she had not been seen in the Clinic in approx 2 years. She was started on milrinone 6/11 due to sluggish diuresis and co-ox 44%. She was started on entresto and spiro on 07/15/14. Her co-ox gradually improved and was measured at 70% on 07/17/14 after milrinone wean.   R and L heart catherization was performed on 07/17/14 and she was noted to have normal coronaries with severe NICM with EF 15% by echo, and well-compensated filling pressures with moderately reduced CO.  While in hospital had a long discussion about advanced therapiesincluding home inotropes, LVAD, and possible transplant. Pt was seen by VAD team including Dr. Prescott Gum and thought to be a candidate for LVAD as bridging therapy. Likely Heartware as opposed to Heartmate II due to patient size.   She had a large portion of her VAD screening tests in the hospital including PFTs, CT C/A/P, pre-VAD dopplers, and cMRI which were all re-assuring for her candicacy. Since last visit she has been seen at the [facility] and felt to be too early for transplant. There was also some concern about her emotional state to handle transplant. She has a f/u appointment with Dr. Posey Pronto in October. She has undergone colonoscopy and mammogram which were ok.   Remains fatigued. Says she can do her ADLs but struggles with doing her errands like going to the store. Says she has accepted the idea of transplant but still upset about it. Denies edema, orthopnea or PND. Weight stable at 109 . No dizziness. Has not started sildenafil yet for elevated PVR on cath. Doesn't feel depressed just stressed out. Has been meeting with a  heart transplant patient to discuss her situation Diana Bird).     STUDIES:  Echo:  (07/13/14); LVEF 15-20% RV moderately HK Mild MR  LHC/RHC 07/17/14 Normal coronaries RA = 3 RV = 27/2/3 PA = 29/13 (21) PCW = 5 Fick cardiac output/index = 3.3/2.3 PVR = 4.8 WU SVR = 2116 FA sat = 99% PA sat = 70%, 71% Ao Pressure: 109/67 (83) LV Pressure: 106/3/9  VQ at [facility] 9/16: low prob  CPX (6/16): peak VO2 14.1, VE/VCO2 slope 38.1 => moderate to severe cardiac functional limitation.   Labs 6/16:  K 4.0 => 4.2, Cr 0.74 => 0.91, Mg 1.9, BNP 1686  ROS: All systems negative except as listed in HPI, PMH and Problem List.  SH:  Social History   Social History  . Marital Status: Married    Spouse Name: N/A  . Number of Children: N/A  . Years of Education: N/A   Occupational History  . Not on file.   Social History Main Topics  . Smoking status: Never Smoker   . Smokeless tobacco: Never Used  . Alcohol Use: No     Comment: occasional - maybe   a glass a month.  . Drug Use: No  . Sexual Activity: Not on file   Other Topics Concern  . Not on file   Social History Narrative   Works full time doing clerical work.     FH:  Family History  Problem Relation Age of Onset  . Heart failure Mother   .  Breast cancer Sister   . Colon cancer Neg Hx   . Colon polyps Neg Hx     Past Medical History  Diagnosis Date  . Chronic systolic heart failure   . Hypertension   . Anemia   . Pneumonia   . CHF (congestive heart failure)   . Shortness of breath dyspnea     at rest at  times  . 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 Hx of adenomatous polyp of colon 09/09/2014    Current Outpatient Prescriptions  Medication Sig Dispense Refill  . acetaminophen (TYLENOL) 325 MG tablet Take 2 tablets (650 mg total) by mouth every 6 (six) hours as needed for mild pain or headache.    . Calcium Carb-Cholecalciferol (CALCIUM-VITAMIN D) 600-400 MG-UNIT TABS Take 1 tablet by mouth daily.    .  carvedilol (COREG) 6.25 MG tablet Take 1.5 tablets (9.375 mg total) by mouth 2 (two) times daily with a meal. 90 tablet 6  . digoxin (LANOXIN) 0.125 MG tablet Take 0.125 mg by mouth daily.    . ferrous sulfate 325 (65 FE) MG tablet Take 325 mg by mouth daily with breakfast.     . furosemide (LASIX) 40 MG tablet Take 2 tablets (80 mg total) by mouth daily. Take an extra 80 mg as needed 120 tablet 1  . ivabradine (CORLANOR) 5 MG TABS tablet Take 1.5 tablets (7.5 mg total) by mouth 2 (two) times daily with a meal. 90 tablet 3  . potassium chloride SA (K-DUR,KLOR-CON) 20 MEQ tablet Take 1 tablet (20 mEq total) by mouth daily. 60 tablet 6  . sacubitril-valsartan (ENTRESTO) 49-51 MG Take 1 tablet by mouth 2 (two) times daily. 60 tablet 6  . spironolactone (ALDACTONE) 25 MG tablet Take 1 tablet (25 mg total) by mouth daily. 30 tablet 6  . sildenafil (REVATIO) 20 MG tablet Take 1 tablet (20 mg total) by mouth 3 (three) times daily. (Patient not taking: Reported on 10/29/2014) 10 tablet 0   No current facility-administered medications for this encounter.    Filed Vitals:   10/29/14 0912  BP: 102/70  Pulse: 61  Weight: 109 lb 12.8 oz (49.805 kg)  SpO2: 98%    PHYSICAL EXAM:  General:  Well appearing. No resp difficulty HEENT: normal Neck: supple. JVP flat Carotids 2+ bilaterally; no bruits. No lymphadenopathy or thryomegaly appreciated. Cor: PMI laterally displaced. Regular rate & rhythm. No s3. No murmur. Lungs: clear Abdomen: soft, nontender, nondistended. No hepatosplenomegaly. No bruits or masses. Good bowel sounds. Extremities: no cyanosis, clubbing, rash, edema Neuro: alert & orientedx3, cranial nerves grossly intact. Moves all 4 extremities w/o difficulty. Affect pleasant.  ASSESSMENT & PLAN: 1. Chronic systolic HF: Nonischemic cardiomyopathy.  LV EF 15-20% with moderate RV dysfunction by echo. Cath 6/16 normal coronaries.   CPX with moderate to severe cardiac functional limitation.   Symptomatically, she is improved.  NYHA class III-IIIB symptoms. She is not volume overloaded on exam. Blood Type O+. Continue transplant w/u at [place] current Coreg and Entresto.  BP is a bit soft at this time for titration.  - Continue digoxin and spironolactone.   - Continue 80 daily with kcl 20 daily. Take extra lasix as needed.  - Continue Corlanor to 7.5 bid - Continue ivabradine - Await sildenafil - we hope to get this soon for her  - Refuses SSRI - Knows to contact me immediately if symptoms worsen.  Bensimhon, Daniel,MD 9:39 AM

## 2014-11-26 ENCOUNTER — Ambulatory Visit (HOSPITAL_COMMUNITY)
Admission: RE | Admit: 2014-11-26 | Discharge: 2014-11-26 | Disposition: A | Discharge status: Home / Self Care | Payer: BC Managed Care – PPO | Source: Ambulatory Visit | Attending: Internal Medicine | Admitting: Internal Medicine

## 2014-11-26 VITALS — BP 84/46 | HR 54 | Wt 117.0 lb

## 2014-11-26 DIAGNOSIS — I5022 Chronic systolic (congestive) heart failure: Secondary | ICD-10-CM | POA: Insufficient documentation

## 2014-11-26 DIAGNOSIS — I110 Hypertensive heart disease with heart failure: Secondary | ICD-10-CM | POA: Insufficient documentation

## 2014-11-26 DIAGNOSIS — I428 Other cardiomyopathies: Secondary | ICD-10-CM | POA: Insufficient documentation

## 2014-11-26 DIAGNOSIS — Z8249 Family history of ischemic heart disease and other diseases of the circulatory system: Secondary | ICD-10-CM | POA: Diagnosis not present

## 2014-11-26 DIAGNOSIS — I272 Other secondary pulmonary hypertension: Secondary | ICD-10-CM | POA: Diagnosis not present

## 2014-11-26 DIAGNOSIS — Z79899 Other long term (current) drug therapy: Secondary | ICD-10-CM | POA: Diagnosis not present

## 2014-11-26 LAB — BASIC METABOLIC PANEL
ANION GAP: 6 (ref 5–15)
BUN: 10 mg/dL (ref 6–20)
CALCIUM: 9.6 mg/dL (ref 8.9–10.3)
CHLORIDE: 103 mmol/L (ref 101–111)
CO2: 31 mmol/L (ref 22–32)
Creatinine, Ser: 0.74 mg/dL (ref 0.44–1.00)
GFR calc Af Amer: >60 mL/min (ref >60)
GFR calc non Af Amer: >60 mL/min (ref >60)
GLUCOSE: 105 mg/dL — AB (ref 65–99)
Potassium: 4.2 mmol/L (ref 3.5–5.1)
Sodium: 140 mmol/L (ref 135–145)

## 2014-11-26 NOTE — Patient Instructions (Signed)
Labs today  Your physician recommends that you schedule a follow-up appointment in: 1 months  Your physician has recommended that you have a cardiopulmonary stress test (CPX). CPX testing is a non-invasive measurement of heart and lung function. It replaces a traditional treadmill stress test. This type of test provides a tremendous amount of information that relates not only to your present condition but also for future outcomes. This test combines measurements of you ventilation, respiratory gas exchange in the lungs, electrocardiogram (EKG), blood pressure and physical response before, during, and following an exercise protocol.  Do the following things EVERYDAY: 1) Weigh yourself in the morning before breakfast. Write it down and keep it in a log. 2) Take your medicines as prescribed 3) Eat low salt foods-Limit salt (sodium) to 2000 mg per day.  4) Stay as active as you can everyday 5) Limit all fluids for the day to less than 2 liters 6)

## 2014-11-26 NOTE — Progress Notes (Addendum)
Patient ID: Diana Bird, female   DOB: Aug 12, 1960, 54 y.o.   MRN: [id number] Primary Cardiologist: Turner/ Bensimhon  HPI: Diana Bird  Is a 54 y/o woman with h/o systolic HF due to NICM dating back to 2007. She was admitted from the HF clinic on 07/12/14 with decompensated HF. Prior to this she had not been seen in the Clinic in approx 2 years. She was started on milrinone 6/11 due to sluggish diuresis and co-ox 44%. She was started on entresto and spiro on 07/15/14. Her co-ox gradually improved and was measured at 70% on 07/17/14 after milrinone wean.   During that admission, R and L heart catherization was performed on 07/17/14 and she was noted to have normal coronaries with severe NICM with EF 15% by echo, and well-compensated filling pressures with moderately reduced  While in hospital had a long discussion about advanced therapiesincluding home inotropes, LVAD, and possible transplant. Pt was seen by VAD team including Dr. Prescott Gum and thought to be a candidate for LVAD as bridging therapy. Likely Heartware as opposed to Heartmate II due to patient size.   She had a large portion of her VAD screening tests in the hospital including PFTs, CT C/A/P, pre-VAD dopplers, and cMRI which were all re-assuring for her candicacy.  She will also need to be evaluated for Heart transplant at [facility]. She is aware that the journey up to a VAD is a difficult process, and has been counseled on the importance of a "teammate". She has mentioned the possibility of this being one of her children or her sister. She has also been counseled on the importance of the HF team in managing a VAD post-placement, and what types of difficulties to expect adjusting to this.   Diana Bird returns for routine followup today.  She continues to do fairly well. At last visit we cut her lasix back. Denies edema, orthopnea, PND or weight gain. Able to do all ADLs without difficulty. Does get DOE when she goes to  store or has to walk further. Taking all meds. Underwent RHC on 10/02/14 Filling pressures looked good with improved CO and mild PAH with PVR 6.0. Sildenafil started but didn't get yet due to cost.   RA = 6 RV = 51/3/7 PA = 49/21 (34) PCW = 13 Fick cardiac output/index = 3.9/2.7 PVR = 6.0 WU Ao sat = 98% PA sat = 69%, 70% SVC sat = 73%  CPX (6/16): peak VO2 14.1, VE/VCO2 slope 38.1 => moderate to severe cardiac functional limitation. Echo 07/13/14: EF 15%   Labs 6/16:  K 4.0 => 4.2, Cr 0.74 => 0.91, Mg 1.9, BNP 1686  ROS: All systems negative except as listed in HPI, PMH and Problem List.  SH:  Social History   Social History  . Marital Status: Married    Spouse Name: N/A  . Number of Children: N/A  . Years of Education: N/A   Occupational History  . Not on file.   Social History Main Topics  . Smoking status: Never Smoker   . Smokeless tobacco: Never Used  . Alcohol Use: No     Comment: occasional - maybe   a glass a month.  . Drug Use: No  . Sexual Activity: Not on file   Other Topics Concern  . Not on file   Social History Narrative   Works full time doing clerical work.     FH:  Family History  Problem Relation Age of Onset  . Heart failure Mother   .  Breast cancer Sister   . Colon cancer Neg Hx   . Colon polyps Neg Hx     Past Medical History  Diagnosis Date  . Chronic systolic heart failure   . Hypertension   . Anemia   . Pneumonia   . CHF (congestive heart failure)   . Shortness of breath dyspnea     at rest at  times  . 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 Hx of adenomatous polyp of colon 09/09/2014    Current Outpatient Prescriptions  Medication Sig Dispense Refill  . acetaminophen (TYLENOL) 325 MG tablet Take 2 tablets (650 mg total) by mouth every 6 (six) hours as needed for mild pain or headache.    . Calcium Carb-Cholecalciferol (CALCIUM-VITAMIN D) 600-400 MG-UNIT TABS Take 1 tablet by mouth daily.    . carvedilol (COREG) 6.25 MG tablet  Take 1.5 tablets (9.375 mg total) by mouth 2 (two) times daily with a meal. 90 tablet 6  . digoxin (LANOXIN) 0.125 MG tablet Take 0.125 mg by mouth daily.    . ferrous sulfate 325 (65 FE) MG tablet Take 325 mg by mouth daily with breakfast.     . furosemide (LASIX) 40 MG tablet Take 40 mg by mouth daily. Take extra 40 mg as needed for swelling    . ivabradine (CORLANOR) 5 MG TABS tablet Take 1.5 tablets (7.5 mg total) by mouth 2 (two) times daily with a meal. 90 tablet 3  . potassium chloride SA (K-DUR,KLOR-CON) 20 MEQ tablet Take 1 tablet (20 mEq total) by mouth daily. 60 tablet 6  . sacubitril-valsartan (ENTRESTO) 49-51 MG Take 1 tablet by mouth 2 (two) times daily. 60 tablet 6  . spironolactone (ALDACTONE) 25 MG tablet Take 1 tablet (25 mg total) by mouth daily. 30 tablet 6   No current facility-administered medications for this encounter.    Filed Vitals:   11/26/14 1119  BP: 84/46  Pulse: 54  Weight: 117 lb (53.071 kg)  SpO2: 100%    PHYSICAL EXAM:  General:  Well appearing. No resp difficulty HEENT: normal Neck: supple. JVP flat Carotids 2+ bilaterally; no bruits. No lymphadenopathy or thryomegaly appreciated. Cor: PMI laterally displaced. Regular rate & rhythm. No s3. No murmur. Lungs: clear Abdomen: soft, nontender, nondistended. No hepatosplenomegaly. No bruits or masses. Good bowel sounds. Extremities: no cyanosis, clubbing, rash, edema Neuro: alert & orientedx3, cranial nerves grossly intact. Moves all 4 extremities w/o difficulty. Affect pleasant.  ASSESSMENT & PLAN: 1. Chronic systolic HF: Nonischemic cardiomyopathy.  LV EF 15-20% with moderate RV dysfunction by echo. Cath 6/16 normal coronaries.   CPX with moderate to severe cardiac functional limitation.  Symptomatically, she is improved. Stable NYHA class III symptoms. She is not volume overloaded on exam. Blood Type O+ undergoing transplant w/u at [place] current Coreg and Entresto.  BP is a bit soft at this  time for titration.  - Continue digoxin and spironolactone.   - Continue lasix 40mg  daily. Can take extra as needed  - Continue Corlanor to 7.5 bid - Recent RHC much improved but with elevated. PVR. Await sildenafil 20 tid - Will repeat echo and CPX test to assess response to therapy.  - She has f/u appt at [facility] transplant clinic this Thursday.  She understands that we may need to proceed with LVAD, especially if she has any clinical worsening.  2. PAH  - as above, start sildenafil 20 tid  Bensimhon, Daniel,MD 11:32 AM

## 2014-11-27 ENCOUNTER — Telehealth (HOSPITAL_COMMUNITY): Payer: Self-pay | Admitting: Vascular Surgery

## 2014-11-27 NOTE — Telephone Encounter (Signed)
Left message to change appt

## 2014-11-29 NOTE — Addendum Note (Signed)
Encounter addended by: Scarlette Calico, RN on: 11/29/2014  4:47 PM<BR>     Documentation filed: [employer], Orders

## 2014-12-02 ENCOUNTER — Telehealth (HOSPITAL_COMMUNITY): Payer: Self-pay | Admitting: Vascular Surgery

## 2014-12-02 NOTE — Telephone Encounter (Signed)
Left pt message to get her scheduled for Echo this week

## 2014-12-04 ENCOUNTER — Ambulatory Visit (HOSPITAL_COMMUNITY)
Admission: RE | Admit: 2014-12-04 | Discharge: 2014-12-04 | Disposition: A | Discharge status: Home / Self Care | Payer: BC Managed Care – PPO | Source: Ambulatory Visit | Attending: Internal Medicine | Admitting: Internal Medicine

## 2014-12-04 DIAGNOSIS — I5189 Other ill-defined heart diseases: Secondary | ICD-10-CM | POA: Insufficient documentation

## 2014-12-04 DIAGNOSIS — I10 Essential (primary) hypertension: Secondary | ICD-10-CM | POA: Diagnosis not present

## 2014-12-04 DIAGNOSIS — I340 Nonrheumatic mitral (valve) insufficiency: Secondary | ICD-10-CM | POA: Diagnosis not present

## 2014-12-04 DIAGNOSIS — I351 Nonrheumatic aortic (valve) insufficiency: Secondary | ICD-10-CM | POA: Diagnosis not present

## 2014-12-04 DIAGNOSIS — I5022 Chronic systolic (congestive) heart failure: Secondary | ICD-10-CM | POA: Diagnosis not present

## 2014-12-04 DIAGNOSIS — I509 Heart failure, unspecified: Secondary | ICD-10-CM | POA: Diagnosis present

## 2014-12-04 DIAGNOSIS — I071 Rheumatic tricuspid insufficiency: Secondary | ICD-10-CM | POA: Insufficient documentation

## 2014-12-04 NOTE — Progress Notes (Signed)
*  PRELIMINARY RESULTS* Echocardiogram 2D Echocardiogram has been performed.  Leavy Cella 12/04/2014, 10:59 AM

## 2014-12-23 ENCOUNTER — Other Ambulatory Visit (HOSPITAL_COMMUNITY): Payer: Self-pay | Admitting: *Deleted

## 2014-12-23 ENCOUNTER — Ambulatory Visit (HOSPITAL_COMMUNITY): Payer: BC Managed Care – PPO | Attending: Internal Medicine

## 2014-12-23 DIAGNOSIS — I5022 Chronic systolic (congestive) heart failure: Secondary | ICD-10-CM

## 2014-12-24 ENCOUNTER — Telehealth (HOSPITAL_COMMUNITY): Payer: Self-pay

## 2014-12-24 NOTE — Telephone Encounter (Signed)
Form completed for disability; placed in Dr. Haroldine Laws folder for signature

## 2014-12-25 ENCOUNTER — Telehealth (HOSPITAL_COMMUNITY): Payer: Self-pay

## 2014-12-25 NOTE — Telephone Encounter (Signed)
Initial Fax failed Resent disability form to : [employer] [phone]

## 2015-01-06 ENCOUNTER — Encounter (HOSPITAL_COMMUNITY): Payer: BC Managed Care – PPO | Admitting: Internal Medicine

## 2015-01-08 ENCOUNTER — Encounter (HOSPITAL_COMMUNITY): Payer: Self-pay | Admitting: Internal Medicine

## 2015-01-08 ENCOUNTER — Ambulatory Visit (HOSPITAL_COMMUNITY)
Admission: RE | Admit: 2015-01-08 | Discharge: 2015-01-08 | Disposition: A | Discharge status: Home / Self Care | Payer: BC Managed Care – PPO | Source: Ambulatory Visit | Attending: Internal Medicine | Admitting: Internal Medicine

## 2015-01-08 VITALS — BP 102/62 | HR 81 | Wt 120.2 lb

## 2015-01-08 DIAGNOSIS — I5022 Chronic systolic (congestive) heart failure: Secondary | ICD-10-CM | POA: Insufficient documentation

## 2015-01-08 DIAGNOSIS — I10 Essential (primary) hypertension: Secondary | ICD-10-CM | POA: Insufficient documentation

## 2015-01-08 DIAGNOSIS — I429 Cardiomyopathy, unspecified: Secondary | ICD-10-CM | POA: Diagnosis not present

## 2015-01-08 DIAGNOSIS — D649 Anemia, unspecified: Secondary | ICD-10-CM | POA: Insufficient documentation

## 2015-01-08 DIAGNOSIS — Z79899 Other long term (current) drug therapy: Secondary | ICD-10-CM | POA: Insufficient documentation

## 2015-01-08 DIAGNOSIS — Z86010 Personal history of colonic polyps: Secondary | ICD-10-CM | POA: Insufficient documentation

## 2015-01-08 LAB — BASIC METABOLIC PANEL
Anion gap: 8 (ref 5–15)
BUN: 12 mg/dL (ref 6–20)
CALCIUM: 9.7 mg/dL (ref 8.9–10.3)
CHLORIDE: 105 mmol/L (ref 101–111)
CO2: 25 mmol/L (ref 22–32)
CREATININE: 0.74 mg/dL (ref 0.44–1.00)
GFR calc non Af Amer: >60 mL/min (ref >60)
Glucose, Bld: 83 mg/dL (ref 65–99)
Potassium: 4.1 mmol/L (ref 3.5–5.1)
SODIUM: 138 mmol/L (ref 135–145)

## 2015-01-08 LAB — BRAIN NATRIURETIC PEPTIDE: B NATRIURETIC PEPTIDE 5: 15.5 pg/mL (ref 0.0–100.0)

## 2015-01-08 NOTE — Addendum Note (Signed)
Encounter addended by: Effie Berkshire, RN on: 01/08/2015 11:16 AM<BR>     Documentation filed: [employer], Patient Instructions Section, Orders

## 2015-01-08 NOTE — Patient Instructions (Signed)
Routine lab work today. Will notify you of abnormal results, otherwise no news is good news!  Follow up 6 weeks.  Do the following things EVERYDAY: 1) Weigh yourself in the morning before breakfast. Write it down and keep it in a log. 2) Take your medicines as prescribed 3) Eat low salt foods-Limit salt (sodium) to 2000 mg per day.  4) Stay as active as you can everyday 5) Limit all fluids for the day to less than 2 liters

## 2015-01-08 NOTE — Progress Notes (Signed)
ADVANCED HF CLINIC NOTE  Patient ID: Diana Bird, female   DOB: Jul 12, 1960, 54 y.o.   MRN: [id number] Primary Cardiologist: Turner/ Beatryce Colombo  HPI: Diana Bird  Is a 54 y/o woman with h/o systolic HF due to NICM dating back to 2007. She was admitted from the HF clinic on 07/12/14 with decompensated HF. Prior to this she had not been seen in the Clinic in approx 2 years. She was started on milrinone 6/11 due to sluggish diuresis and co-ox 44%. She was started on entresto and spiro on 07/15/14. Her co-ox gradually improved and was measured at 70% on 07/17/14 after milrinone wean.   During that admission, R and L heart catherization was performed on 07/17/14 and she was noted to have normal coronaries with severe NICM with EF 15% by echo, and well-compensated filling pressures with moderately reduced  While in hospital had a long discussion about advanced therapie sincluding home inotropes, LVAD, and possible transplant. Pt was seen by VAD team including Dr. Prescott Gum and thought to be a candidate for LVAD as bridging therapy. Likely Heartware as opposed to Heartmate II due to patient size.   She had a large portion of her VAD screening tests in the hospital including PFTs, CT C/A/P, pre-VAD dopplers, and cMRI which were all re-assuring for her candicacy.  She will also need to be evaluated for Heart transplant at [facility]. She is aware that the journey up to a VAD is a difficult process, and has been counseled on the importance of a "teammate". She has mentioned the possibility of this being one of her children or her sister. She has also been counseled on the importance of the HF team in managing a VAD post-placement, and what types of difficulties to expect adjusting to this.   [name] returns for routine followup today. Overall doing fairly well. But still with class III fatigue with activities. Denies edema, orthopnea, PND or weight gain. Able to do all ADLs without  difficulty. Taking all meds. Now taking lasix bid. Has gained 10 pounds in last 2 months. Great appetite.  Repeat CPX shows about 20% improvement over test in 6/16. pVO2 16.2 ml/kg/min Sildenafil started but didn't get yet due to cost.   RHC 8/16 RA = 6 RV = 51/3/7 PA = 49/21 (34) PCW = 13 Fick cardiac output/index = 3.9/2.7 PVR = 6.0 WU Ao sat = 98% PA sat = 69%, 70% SVC sat = 73%  CPX (6/16): peak VO2 14.1, VE/VCO2 slope 38.1 => moderate to severe cardiac functional limitation. CPX 12/23/14: FVC 1.77 (61%)    FEV1 1.41 (60%)     FEV1/FVC 80 (98%)      Resting HR: 57 Peak HR: 105  (64% age predicted max HR) BP rest: 106/58 BP peak: 116/60 Peak VO2: 16.2 (61% predicted peak VO2) VE/VCO2 slope: 35.8 OUES: 0.88 Peak RER: 1.22 Ventilatory Threshold: 10.6 (39.9% predicted or measured peak VO2) Peak RR 69 Peak Ventilation: 38.4 VE/MVV: 58.2% PETCO2 at peak: 373 O2pulse: 8  (89% predicted O2pulse)  Echo 07/13/14: EF 15%  Echo 11/16: EF 20-25% Moderate RV dysfunction    Labs 6/16:  K 4.0 => 4.2, Cr 0.74 => 0.91, Mg 1.9, BNP 1686  ROS: All systems negative except as listed in HPI, PMH and Problem List.  SH:  Social History   Social History  . Marital Status: Married    Spouse Name: N/A  . Number of Children: N/A  . Years of Education: N/A   Occupational History  .  Not on file.   Social History Main Topics  . Smoking status: Never Smoker   . Smokeless tobacco: Never Used  . Alcohol Use: No     Comment: occasional - maybe   a glass a month.  . Drug Use: No  . Sexual Activity: Not on file   Other Topics Concern  . Not on file   Social History Narrative   Works full time doing clerical work.     FH:  Family History  Problem Relation Age of Onset  . Heart failure Mother   . Breast cancer Sister   . Colon cancer Neg Hx   . Colon polyps Neg Hx     Past Medical History  Diagnosis Date  . Chronic systolic heart failure ([place])   .  Hypertension   . Anemia   . Pneumonia   . CHF (congestive heart failure) ([place])   . Shortness of breath dyspnea     at rest at  times  . 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 Hx of adenomatous polyp of colon 09/09/2014    Current Outpatient Prescriptions  Medication Sig Dispense Refill  . acetaminophen (TYLENOL) 325 MG tablet Take 2 tablets (650 mg total) by mouth every 6 (six) hours as needed for mild pain or headache.    . Calcium Carb-Cholecalciferol (CALCIUM-VITAMIN D) 600-400 MG-UNIT TABS Take 1 tablet by mouth daily.    . carvedilol (COREG) 6.25 MG tablet Take 1.5 tablets (9.375 mg total) by mouth 2 (two) times daily with a meal. 90 tablet 6  . digoxin (LANOXIN) 0.125 MG tablet Take 0.125 mg by mouth daily.    . ferrous sulfate 325 (65 FE) MG tablet Take 325 mg by mouth daily with breakfast.     . furosemide (LASIX) 40 MG tablet Take 40 mg by mouth daily. Take extra 40 mg as needed for swelling    . ivabradine (CORLANOR) 5 MG TABS tablet Take 1.5 tablets (7.5 mg total) by mouth 2 (two) times daily with a meal. 90 tablet 3  . potassium chloride SA (K-DUR,KLOR-CON) 20 MEQ tablet Take 1 tablet (20 mEq total) by mouth daily. 60 tablet 6  . sacubitril-valsartan (ENTRESTO) 49-51 MG Take 1 tablet by mouth 2 (two) times daily. 60 tablet 6  . spironolactone (ALDACTONE) 25 MG tablet Take 1 tablet (25 mg total) by mouth daily. 30 tablet 6   No current facility-administered medications for this encounter.    Filed Vitals:   01/08/15 0925  BP: 102/62  Pulse: 81  Weight: 120 lb 4 oz (54.545 kg)  SpO2: 98%    PHYSICAL EXAM:  General:  Well appearing. No resp difficulty HEENT: normal Neck: supple. JVP flat Carotids 2+ bilaterally; no bruits. No lymphadenopathy or thryomegaly appreciated. Cor: PMI laterally displaced. Regular rate & rhythm. No s3. No murmur. Lungs: clear Abdomen: soft, nontender, nondistended. No hepatosplenomegaly. No bruits or masses. Good bowel sounds. Extremities: no  cyanosis, clubbing, rash, edema Neuro: alert & orientedx3, cranial nerves grossly intact. Moves all 4 extremities w/o difficulty. Affect pleasant.  ASSESSMENT & PLAN: 1. Chronic systolic HF: Nonischemic cardiomyopathy.  LV EF 20-25% with moderate RV dysfunction by echo. Cath 6/16 normal coronaries.   CPX with moderate cardiac functional limitation improved 20% since previous. Stable NYHA class III symptoms. She is not volume overloaded on exam. Blood Type O+ has been seen for transplant w/u at [place] current Coreg and Entresto.  BP is a bit soft at this time for titration.  -  Continue digoxin and spironolactone.   - Continue lasix 40mg  bid. Can take extra as needed  - Continue Corlanor to 7.5 bid 2. PAH  - Mild. Will follow as she has been unable to get sildenafil.   Overall improving. Remains NYHA III with EF 20-25% but CPX test is improved. BP is low. Will continue current regimen for now. Will see back in 6 weeks  to see if we can titrate meds more. We discussed ICD and she would like to wait and see if EF will improve on next echo in march. She realizes there is some risk to that approach.  Check BMET and BNP today return in 6 weeks.   Yalonda Sample,MD 10:44 AM

## 2015-01-19 ENCOUNTER — Emergency Department (HOSPITAL_COMMUNITY): Payer: BC Managed Care – PPO

## 2015-01-19 ENCOUNTER — Encounter (HOSPITAL_COMMUNITY): Payer: Self-pay | Admitting: Emergency Medicine

## 2015-01-19 ENCOUNTER — Emergency Department (HOSPITAL_COMMUNITY)
Admission: EM | Admit: 2015-01-19 | Discharge: 2015-01-19 | Disposition: A | Discharge status: Home / Self Care | Payer: BC Managed Care – PPO | Attending: Emergency Medicine | Admitting: Emergency Medicine

## 2015-01-19 DIAGNOSIS — Z79899 Other long term (current) drug therapy: Secondary | ICD-10-CM | POA: Diagnosis not present

## 2015-01-19 DIAGNOSIS — Z86010 Personal history of colonic polyps: Secondary | ICD-10-CM | POA: Insufficient documentation

## 2015-01-19 DIAGNOSIS — Z9889 Other specified postprocedural states: Secondary | ICD-10-CM | POA: Diagnosis not present

## 2015-01-19 DIAGNOSIS — Y92410 Unspecified street and highway as the place of occurrence of the external cause: Secondary | ICD-10-CM | POA: Diagnosis not present

## 2015-01-19 DIAGNOSIS — S4991XA Unspecified injury of right shoulder and upper arm, initial encounter: Secondary | ICD-10-CM | POA: Diagnosis not present

## 2015-01-19 DIAGNOSIS — S4992XA Unspecified injury of left shoulder and upper arm, initial encounter: Secondary | ICD-10-CM | POA: Insufficient documentation

## 2015-01-19 DIAGNOSIS — S29012A Strain of muscle and tendon of back wall of thorax, initial encounter: Secondary | ICD-10-CM | POA: Insufficient documentation

## 2015-01-19 DIAGNOSIS — I10 Essential (primary) hypertension: Secondary | ICD-10-CM | POA: Diagnosis not present

## 2015-01-19 DIAGNOSIS — Y9389 Activity, other specified: Secondary | ICD-10-CM | POA: Insufficient documentation

## 2015-01-19 DIAGNOSIS — Z8701 Personal history of pneumonia (recurrent): Secondary | ICD-10-CM | POA: Insufficient documentation

## 2015-01-19 DIAGNOSIS — Z85038 Personal history of other malignant neoplasm of large intestine: Secondary | ICD-10-CM | POA: Insufficient documentation

## 2015-01-19 DIAGNOSIS — I5022 Chronic systolic (congestive) heart failure: Secondary | ICD-10-CM | POA: Insufficient documentation

## 2015-01-19 DIAGNOSIS — S134XXA Sprain of ligaments of cervical spine, initial encounter: Secondary | ICD-10-CM | POA: Insufficient documentation

## 2015-01-19 DIAGNOSIS — Y999 Unspecified external cause status: Secondary | ICD-10-CM | POA: Diagnosis not present

## 2015-01-19 DIAGNOSIS — S3992XA Unspecified injury of lower back, initial encounter: Secondary | ICD-10-CM | POA: Insufficient documentation

## 2015-01-19 DIAGNOSIS — S199XXA Unspecified injury of neck, initial encounter: Secondary | ICD-10-CM | POA: Diagnosis present

## 2015-01-19 DIAGNOSIS — S0990XA Unspecified injury of head, initial encounter: Secondary | ICD-10-CM | POA: Diagnosis not present

## 2015-01-19 DIAGNOSIS — D649 Anemia, unspecified: Secondary | ICD-10-CM | POA: Insufficient documentation

## 2015-01-19 MED ORDER — NAPROXEN 500 MG PO TABS: 500.0000 mg | ORAL_TABLET | Freq: Two times a day (BID) | ORAL | Status: AC

## 2015-01-19 MED ORDER — METHOCARBAMOL 500 MG PO TABS: 500.0000 mg | ORAL_TABLET | Freq: Two times a day (BID) | ORAL | Status: AC

## 2015-01-19 MED ORDER — METHOCARBAMOL 500 MG PO TABS
500.0000 mg | ORAL_TABLET | Freq: Once | ORAL | Status: AC
  Administered 2015-01-19: 500 mg via ORAL
  Filled 2015-01-19 (×2): qty 1

## 2015-01-19 MED ORDER — NAPROXEN 500 MG PO TABS
500.0000 mg | ORAL_TABLET | Freq: Once | ORAL | Status: AC
  Administered 2015-01-19: 500 mg via ORAL
  Filled 2015-01-19: qty 1
  Filled 2015-01-19: qty 2

## 2015-01-19 NOTE — ED Notes (Signed)
Patient transported to X-ray 
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
## 2015-01-20 ENCOUNTER — Telehealth (HOSPITAL_COMMUNITY): Payer: Self-pay | Admitting: Vascular Surgery

## 2015-01-20 NOTE — Telephone Encounter (Signed)
Will forward to [place] thanks!
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
## 2015-01-29 ENCOUNTER — Encounter (HOSPITAL_COMMUNITY): Payer: Self-pay

## 2015-01-29 NOTE — Progress Notes (Signed)
Short term disability form completed and sgined by Dr. Haroldine Laws. Faxed to provided [phone] Attn: Shane Crutch (Benefits Manager). Copy of form scanned into electronic medical records.  Renee Pain

## 2015-02-06 ENCOUNTER — Telehealth (HOSPITAL_COMMUNITY): Payer: Self-pay

## 2015-02-06 NOTE — Telephone Encounter (Signed)
Christine RN with [employer] called and left VM on CHF triage line on behalf of patient disability claim to obtain additional info regarding last EF and transplant status. Left returning VM with last known EF and last known apt with [facility] for transplant eval.  Renee Pain

## 2015-02-17 ENCOUNTER — Encounter (HOSPITAL_COMMUNITY): Payer: Self-pay | Admitting: Internal Medicine

## 2015-02-17 ENCOUNTER — Ambulatory Visit (HOSPITAL_COMMUNITY)
Admission: RE | Admit: 2015-02-17 | Discharge: 2015-02-17 | Disposition: A | Discharge status: Home / Self Care | Payer: BC Managed Care – PPO | Source: Ambulatory Visit | Attending: Internal Medicine | Admitting: Internal Medicine

## 2015-02-17 VITALS — BP 110/68 | HR 59 | Wt 122.2 lb

## 2015-02-17 DIAGNOSIS — I10 Essential (primary) hypertension: Secondary | ICD-10-CM | POA: Diagnosis not present

## 2015-02-17 DIAGNOSIS — Z79899 Other long term (current) drug therapy: Secondary | ICD-10-CM | POA: Diagnosis not present

## 2015-02-17 DIAGNOSIS — I5022 Chronic systolic (congestive) heart failure: Secondary | ICD-10-CM | POA: Insufficient documentation

## 2015-02-17 DIAGNOSIS — I429 Cardiomyopathy, unspecified: Secondary | ICD-10-CM | POA: Insufficient documentation

## 2015-02-17 NOTE — Progress Notes (Signed)
Patient ID: Diana Bird, female   DOB: 17-Jul-1960, 55 y.o.   MRN: [id number]  ADVANCED HF CLINIC NOTE  Patient ID: Diana Bird, female   DOB: January 30, 1961, 55 y.o.   MRN: [id number] Primary Cardiologist: Turner/ Danaysha Kirn  HPI: Diana Bird  Is a 55 y/o woman with h/o systolic HF due to NICM dating back to 2007. She was admitted from the HF clinic on 07/12/14 with decompensated HF. Prior to this she had not been seen in the Clinic in approx 2 years. She was started on milrinone 6/11 due to sluggish diuresis and co-ox 44%. She was started on entresto and spiro on 07/15/14. Her co-ox gradually improved and was measured at 70% on 07/17/14 after milrinone wean.   During that admission, R and L heart catherization was performed on 07/17/14 and she was noted to have normal coronaries with severe NICM with EF 15% by echo, and well-compensated filling pressures with moderately reduced  While in hospital had a long discussion about advanced therapie sincluding home inotropes, LVAD, and possible transplant. Pt was seen by VAD team including Dr. Prescott Gum and thought to be a candidate for LVAD as bridging therapy. Likely Heartware as opposed to Heartmate II due to patient size.   She had a large portion of her VAD screening tests in the hospital including PFTs, CT C/A/P, pre-VAD dopplers, and cMRI which were all re-assuring for her candicacy.  She will also need to be evaluated for Heart transplant at [facility]. She is aware that the journey up to a VAD is a difficult process, and has been counseled on the importance of a "teammate". She has mentioned the possibility of this being one of her children or her sister. She has also been counseled on the importance of the HF team in managing a VAD post-placement, and what types of difficulties to expect adjusting to this.   Mrs Rayborn returns for routines for HF follow up. Last visit ICD discussed however she would like to wait until repeat  ECHO in March. Overall doing ok. She has ongoing intermittent dyspnea with exertion. Does seem to notice when she is carrying items. She limits that activity. Weight at home 120-122 pounds. Appetite improving. Taking all medications.   RHC 8/16 RA = 6 RV = 51/3/7 PA = 49/21 (34) PCW = 13 Fick cardiac output/index = 3.9/2.7 PVR = 6.0 WU Ao sat = 98% PA sat = 69%, 70% SVC sat = 73%  CPX (6/16): peak VO2 14.1, VE/VCO2 slope 38.1 => moderate to severe cardiac functional limitation. CPX 12/23/14: FVC 1.77 (61%)    FEV1 1.41 (60%)     FEV1/FVC 80 (98%)      Resting HR: 57 Peak HR: 105  (64% age predicted max HR) BP rest: 106/58 BP peak: 116/60 Peak VO2: 16.2 (61% predicted peak VO2) VE/VCO2 slope: 35.8 OUES: 0.88 Peak RER: 1.22 Ventilatory Threshold: 10.6 (39.9% predicted or measured peak VO2) Peak RR 69 Peak Ventilation: 38.4 VE/MVV: 58.2% PETCO2 at peak: 373 O2pulse: 8  (89% predicted O2pulse)  Echo 07/13/14: EF 15%  Echo 11/16: EF 20-25% Moderate RV dysfunction   Labs 6/16:  K 4.0 => 4.2, Cr 0.74 => 0.91, Mg 1.9, BNP 1686 Labs 01/08/2015: K 4.1 Creatinine 0.74 BNP 15  ROS: All systems negative except as listed in HPI, PMH and Problem List.  SH:  Social History   Social History  . Marital Status: Married    Spouse Name: N/A  . Number of Children: N/A  . Years of  Education: N/A   Occupational History  . Not on file.   Social History Main Topics  . Smoking status: Never Smoker   . Smokeless tobacco: Never Used  . Alcohol Use: No     Comment: occasional - maybe   a glass a month.  . Drug Use: No  . Sexual Activity: Not on file   Other Topics Concern  . Not on file   Social History Narrative   Works full time doing clerical work.     FH:  Family History  Problem Relation Age of Onset  . Heart failure Mother   . Breast cancer Sister   . Colon cancer Neg Hx   . Colon polyps Neg Hx     Past Medical History  Diagnosis Date  . Chronic  systolic heart failure ([place])   . Hypertension   . Anemia   . Pneumonia   . CHF (congestive heart failure) ([place])   . Shortness of breath dyspnea     at rest at  times  . 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 Hx of adenomatous polyp of colon 09/09/2014    Current Outpatient Prescriptions  Medication Sig Dispense Refill  . Calcium Carb-Cholecalciferol (CALCIUM-VITAMIN D) 600-400 MG-UNIT TABS Take 1 tablet by mouth daily.    . carvedilol (COREG) 6.25 MG tablet Take 1.5 tablets (9.375 mg total) by mouth 2 (two) times daily with a meal. 90 tablet 6  . digoxin (LANOXIN) 0.125 MG tablet Take 0.125 mg by mouth daily.    . ferrous sulfate 325 (65 FE) MG tablet Take 325 mg by mouth daily with breakfast.     . furosemide (LASIX) 40 MG tablet Take 40 mg by mouth daily. Take extra 40 mg as needed for swelling    . ivabradine (CORLANOR) 5 MG TABS tablet Take 1.5 tablets (7.5 mg total) by mouth 2 (two) times daily with a meal. 90 tablet 3  . methocarbamol (ROBAXIN) 500 MG tablet Take 1 tablet (500 mg total) by mouth 2 (two) times daily. 20 tablet 0  . naproxen (NAPROSYN) 500 MG tablet Take 1 tablet (500 mg total) by mouth 2 (two) times daily. 30 tablet 0  . potassium chloride SA (K-DUR,KLOR-CON) 20 MEQ tablet Take 1 tablet (20 mEq total) by mouth daily. 60 tablet 6  . sacubitril-valsartan (ENTRESTO) 49-51 MG Take 1 tablet by mouth 2 (two) times daily. 60 tablet 6  . spironolactone (ALDACTONE) 25 MG tablet Take 1 tablet (25 mg total) by mouth daily. 30 tablet 6  . acetaminophen (TYLENOL) 325 MG tablet Take 2 tablets (650 mg total) by mouth every 6 (six) hours as needed for mild pain or headache. (Patient not taking: Reported on 02/17/2015)     No current facility-administered medications for this encounter.    Filed Vitals:   02/17/15 0935  BP: 110/68  Pulse: 59  Weight: 122 lb 4 oz (55.452 kg)  SpO2: 100%    PHYSICAL EXAM:  General:  Well appearing. No resp difficulty HEENT: normal Neck: supple.  JVP flat Carotids 2+ bilaterally; no bruits. No lymphadenopathy or thryomegaly appreciated. Cor: PMI laterally displaced. Regular rate & rhythm. No s3. No murmur. Lungs: clear Abdomen: soft, nontender, nondistended. No hepatosplenomegaly. No bruits or masses. Good bowel sounds. Extremities: no cyanosis, clubbing, rash, edema Neuro: alert & orientedx3, cranial nerves grossly intact. Moves all 4 extremities w/o difficulty. Affect pleasant.  ASSESSMENT & PLAN: 1. Chronic systolic HF: Nonischemic cardiomyopathy.  LV EF 20-25% with moderate RV dysfunction by echo.  Cath 6/16 normal coronaries.   CPX with moderate cardiac functional limitation improved 20% since previous. Stable NYHA class II-III symptoms.  - Volume status stable. Continue lasix 40 mg daily. Can take extra as needed  - Continue current Coreg 9.375 mg twice a day and Entresto 49-51 mg twice a day.     - Continue digoxin 0.125 mg daily  and spironolactone 25 mg daily   - Continue Corlanor to 7.5 bid -Blood Type O+ has been seen for transplant w/u at [facility] 2. PAH  - Mild. Will follow as she has been unable to get sildenafil.   Follow up in March with an ECHO.   Darrick Grinder, NP- C 10:07 AM   Patient seen and examined with Darrick Grinder, NP. We discussed all aspects of the encounter. I agree with the assessment and plan as stated above.   Doing well. NYHA II-III. Volume status ok. BP too soft to titrate meds much more right now. Will continue current regimen. Will repeat echo at next visit.    Charise Leinbach,MD 9:57 PM

## 2015-02-17 NOTE — Patient Instructions (Signed)
Your physician recommends that you schedule a follow-up appointment in: 2 months with echocardiogram  

## 2015-02-21 ENCOUNTER — Telehealth (HOSPITAL_COMMUNITY): Payer: Self-pay | Admitting: *Deleted

## 2015-02-21 NOTE — Telephone Encounter (Signed)
Pt called to let us know that she went to pick up all her medications yesterday at the pharmacy. Her Colanor was $465.  She will come by and pick up samples.   Told her that we would let Doroteo Bradford our Pharm know and maybe we can get her on an assistance plan.   Medication Samples have been provided to the patient.  Drug name: Colanor  Qty: 56 tablets (4 bottles.)  LOT: [id number]  Exp.Date: 09/17  The patient has been instructed regarding the correct time, dose, and frequency of taking this medication, including desired effects and most common side effects.   Philemon Kingdom D 10:35 AM 02/21/2015

## 2015-02-26 NOTE — Telephone Encounter (Signed)
Spoke with Selah about [employer]. Because she has a deductible of ~$1000 to meet, her medication costs will be higher until she meets this.   Ruta Hinds. Velva Harman, PharmD, BCPS, CPP Clinical Pharmacist Pager: [phone] Phone: [phone] 02/26/2015 12:34 PM

## 2015-02-26 NOTE — Telephone Encounter (Signed)
Left vm for pt to callback 

## 2015-02-27 ENCOUNTER — Telehealth (HOSPITAL_COMMUNITY): Payer: Self-pay | Admitting: Cardiology

## 2015-02-27 NOTE — Telephone Encounter (Signed)
Pt aware of deductible and Corlanor cost

## 2015-04-17 ENCOUNTER — Encounter (HOSPITAL_COMMUNITY): Payer: Self-pay | Admitting: Internal Medicine

## 2015-04-17 ENCOUNTER — Ambulatory Visit (HOSPITAL_BASED_OUTPATIENT_CLINIC_OR_DEPARTMENT_OTHER)
Admission: RE | Admit: 2015-04-17 | Discharge: 2015-04-17 | Disposition: A | Discharge status: Home / Self Care | Payer: BC Managed Care – PPO | Source: Ambulatory Visit | Attending: Internal Medicine | Admitting: Internal Medicine

## 2015-04-17 ENCOUNTER — Ambulatory Visit (HOSPITAL_COMMUNITY)
Admission: RE | Admit: 2015-04-17 | Discharge: 2015-04-17 | Disposition: A | Discharge status: Home / Self Care | Payer: BC Managed Care – PPO | Source: Ambulatory Visit | Attending: Internal Medicine | Admitting: Internal Medicine

## 2015-04-17 VITALS — BP 125/79 | HR 85 | Resp 18 | Wt 124.0 lb

## 2015-04-17 DIAGNOSIS — Z8249 Family history of ischemic heart disease and other diseases of the circulatory system: Secondary | ICD-10-CM | POA: Diagnosis not present

## 2015-04-17 DIAGNOSIS — I428 Other cardiomyopathies: Secondary | ICD-10-CM | POA: Diagnosis not present

## 2015-04-17 DIAGNOSIS — I5022 Chronic systolic (congestive) heart failure: Secondary | ICD-10-CM | POA: Insufficient documentation

## 2015-04-17 DIAGNOSIS — Z79899 Other long term (current) drug therapy: Secondary | ICD-10-CM | POA: Diagnosis not present

## 2015-04-17 DIAGNOSIS — I110 Hypertensive heart disease with heart failure: Secondary | ICD-10-CM | POA: Insufficient documentation

## 2015-04-17 DIAGNOSIS — I272 Other secondary pulmonary hypertension: Secondary | ICD-10-CM | POA: Diagnosis not present

## 2015-04-17 LAB — DIGOXIN LEVEL

## 2015-04-17 LAB — BASIC METABOLIC PANEL
Anion gap: 12 (ref 5–15)
BUN: 13 mg/dL (ref 6–20)
CALCIUM: 9.7 mg/dL (ref 8.9–10.3)
CO2: 23 mmol/L (ref 22–32)
Chloride: 105 mmol/L (ref 101–111)
Creatinine, Ser: 0.69 mg/dL (ref 0.44–1.00)
GFR calc Af Amer: >60 mL/min (ref >60)
GLUCOSE: 78 mg/dL (ref 65–99)
POTASSIUM: 4.4 mmol/L (ref 3.5–5.1)
SODIUM: 140 mmol/L (ref 135–145)

## 2015-04-17 MED ORDER — CARVEDILOL 12.5 MG PO TABS: 12.5000 mg | ORAL_TABLET | Freq: Two times a day (BID) | ORAL | Status: AC

## 2015-04-17 NOTE — Progress Notes (Signed)
  Echocardiogram 2D Echocardiogram has been performed.  Diana Bird 04/17/2015, 10:01 AM

## 2015-04-17 NOTE — Patient Instructions (Signed)
Increase Carvedilol to 12.5 mg Twice daily, we have sent you in a new prescription for 12.5 mg tablets  Labs today  We will contact you in 3 months to schedule your next appointment.

## 2015-04-17 NOTE — Addendum Note (Signed)
Encounter addended by: Scarlette Calico, RN on: 04/17/2015 10:45 AM<BR>     Documentation filed: [employer], Patient Instructions Section, Orders

## 2015-04-17 NOTE — Progress Notes (Signed)
Patient ID: Jasper Riling, female   DOB: [birth year], 55 y.o.   MRN: [id number] Patient ID: Jasper Riling, female   DOB: 1960-05-24, 55 y.o.   MRN: [id number]  ADVANCED HF CLINIC NOTE  Primary Cardiologist: Turner/ Jhamari Markowicz  HPI: Ms. Madar  Is a 55 y/o woman with h/o systolic HF due to NICM dating back to 2007. She was admitted from the HF clinic on 07/12/14 with decompensated HF. Prior to this she had not been seen in the Clinic in approx 2 years. She was started on milrinone 6/11 due to sluggish diuresis and co-ox 44%. She was started on entresto and spiro on 07/15/14. Her co-ox gradually improved and was measured at 70% on 07/17/14 after milrinone wean.   During that admission, R and L heart catherization was performed on 07/17/14 and she was noted to have normal coronaries with severe NICM with EF 15% by echo, and well-compensated filling pressures with moderately reduced  While in hospital had a long discussion about advanced therapie sincluding home inotropes, LVAD, and possible transplant. Pt was seen by VAD team including Dr. Prescott Gum and thought to be a candidate for LVAD as bridging therapy. Likely Heartware as opposed to Heartmate II due to patient size.   She had a large portion of her VAD screening tests in the hospital including PFTs, CT C/A/P, pre-VAD dopplers, and cMRI which were all re-assuring for her candicacy.  She will also need to be evaluated for Heart transplant at [facility]. She is aware that the journey up to a VAD is a difficult process, and has been counseled on the importance of a "teammate". She has mentioned the possibility of this being one of her children or her sister. She has also been counseled on the importance of the HF team in managing a VAD post-placement, and what types of difficulties to expect adjusting to this.   Mrs Coday returns for routines for HF follow up. Last visit ICD discussed however she would like to wait until repeat  ECHO in March. Overall doing ok. Over past week has had URI and coughing. Feels like she is getting better. Prior to URI she had stable NYHA II-III HF symptoms. No edema, orthopnea or PND. Weight at home 120-122 pounds. Taking all medications.   ECHO  ~30% (images reviewed personally). RV ok   RHC 8/16 RA = 6 RV = 51/3/7 PA = 49/21 (34) PCW = 13 Fick cardiac output/index = 3.9/2.7 PVR = 6.0 WU Ao sat = 98% PA sat = 69%, 70% SVC sat = 73%  CPX (6/16): peak VO2 14.1, VE/VCO2 slope 38.1 => moderate to severe cardiac functional limitation. CPX 12/23/14: FVC 1.77 (61%)    FEV1 1.41 (60%)     FEV1/FVC 80 (98%)      Resting HR: 57 Peak HR: 105  (64% age predicted max HR) BP rest: 106/58 BP peak: 116/60 Peak VO2: 16.2 (61% predicted peak VO2) VE/VCO2 slope: 35.8 OUES: 0.88 Peak RER: 1.22 Ventilatory Threshold: 10.6 (39.9% predicted or measured peak VO2) Peak RR 69 Peak Ventilation: 38.4 VE/MVV: 58.2% PETCO2 at peak: 373 O2pulse: 8  (89% predicted O2pulse)  Echo 07/13/14: EF 15%  Echo 11/16: EF 20-25% Moderate RV dysfunction   Labs 6/16:  K 4.0 => 4.2, Cr 0.74 => 0.91, Mg 1.9, BNP 1686 Labs 01/08/2015: K 4.1 Creatinine 0.74 BNP 15  ROS: All systems negative except as listed in HPI, PMH and Problem List.  SH:  Social History   Social History  . Marital Status:  Married    Spouse Name: N/A  . Number of Children: N/A  . Years of Education: N/A   Occupational History  . Not on file.   Social History Main Topics  . Smoking status: Never Smoker   . Smokeless tobacco: Never Used  . Alcohol Use: No     Comment: occasional - maybe   a glass a month.  . Drug Use: No  . Sexual Activity: Not on file   Other Topics Concern  . Not on file   Social History Narrative   Works full time doing clerical work.     FH:  Family History  Problem Relation Age of Onset  . Heart failure Mother   . Breast cancer Sister   . Colon cancer Neg Hx   . Colon polyps Neg  Hx     Past Medical History  Diagnosis Date  . Chronic systolic heart failure ([place])   . Hypertension   . Anemia   . Pneumonia   . CHF (congestive heart failure) ([place])   . Shortness of breath dyspnea     at rest at  times  . 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 Hx of adenomatous polyp of colon 09/09/2014    Current Outpatient Prescriptions  Medication Sig Dispense Refill  . acetaminophen (TYLENOL) 325 MG tablet Take 2 tablets (650 mg total) by mouth every 6 (six) hours as needed for mild pain or headache.    . Calcium Carb-Cholecalciferol (CALCIUM-VITAMIN D) 600-400 MG-UNIT TABS Take 1 tablet by mouth daily.    . carvedilol (COREG) 6.25 MG tablet Take 1.5 tablets (9.375 mg total) by mouth 2 (two) times daily with a meal. 90 tablet 6  . digoxin (LANOXIN) 0.125 MG tablet Take 0.125 mg by mouth daily.    . ferrous sulfate 325 (65 FE) MG tablet Take 325 mg by mouth daily with breakfast.     . furosemide (LASIX) 40 MG tablet Take 40 mg by mouth daily. Take extra 40 mg as needed for swelling    . ivabradine (CORLANOR) 5 MG TABS tablet Take 1.5 tablets (7.5 mg total) by mouth 2 (two) times daily with a meal. 90 tablet 3  . methocarbamol (ROBAXIN) 500 MG tablet Take 1 tablet (500 mg total) by mouth 2 (two) times daily. 20 tablet 0  . naproxen (NAPROSYN) 500 MG tablet Take 1 tablet (500 mg total) by mouth 2 (two) times daily. 30 tablet 0  . potassium chloride SA (K-DUR,KLOR-CON) 20 MEQ tablet Take 1 tablet (20 mEq total) by mouth daily. 60 tablet 6  . sacubitril-valsartan (ENTRESTO) 49-51 MG Take 1 tablet by mouth 2 (two) times daily. 60 tablet 6  . spironolactone (ALDACTONE) 25 MG tablet Take 1 tablet (25 mg total) by mouth daily. 30 tablet 6   No current facility-administered medications for this encounter.    Filed Vitals:   04/17/15 1010  BP: 125/79  Pulse: 85  Resp: 18  Weight: 124 lb (56.246 kg)  SpO2: 100%    PHYSICAL EXAM:  General:  Well appearing. No resp difficulty HEENT:  normal Neck: supple. JVP flat Carotids 2+ bilaterally; no bruits. No lymphadenopathy or thryomegaly appreciated. Cor: PMI laterally displaced. Regular rate & rhythm. No s3. No murmur. Lungs: clear Abdomen: soft, nontender, nondistended. No hepatosplenomegaly. No bruits or masses. Good bowel sounds. Extremities: no cyanosis, clubbing, rash, edema Neuro: alert & orientedx3, cranial nerves grossly intact. Moves all 4 extremities w/o difficulty. Affect pleasant.  ASSESSMENT & PLAN: 1. Chronic systolic HF:  Nonischemic cardiomyopathy.  LV EF 30%. Cath 6/16 normal coronaries.   CPX with moderate cardiac functional limitation improved 20% since previous. Stable NYHA class II-III symptoms.  - Volume status stable. Continue lasix 40 mg daily. Can take extra as needed  - Increase Coreg 12.5 mg twice a day -Continue  Entresto 49-51 mg twice a day.     - Continue digoxin 0.125 mg daily  and spironolactone 25 mg daily   - Continue Corlanor to 7.5 bid -Blood Type O+ has been seen for transplant w/u at [facility] - Overall improving EF improved but still < 35%. Wants to wait on ICD for now 2. PAH  - Mild. Will follow as she has been unable to get sildenafil.   BMET today  Ray Gervasi,MD 10:31 AM

## 2015-07-15 ENCOUNTER — Ambulatory Visit (HOSPITAL_COMMUNITY)
Admission: RE | Admit: 2015-07-15 | Discharge: 2015-07-15 | Disposition: A | Discharge status: Home / Self Care | Payer: BC Managed Care – PPO | Source: Ambulatory Visit | Attending: Internal Medicine | Admitting: Internal Medicine

## 2015-07-15 ENCOUNTER — Encounter (HOSPITAL_COMMUNITY): Payer: Self-pay | Admitting: Internal Medicine

## 2015-07-15 VITALS — BP 118/66 | HR 72 | Wt 122.0 lb

## 2015-07-15 DIAGNOSIS — I428 Other cardiomyopathies: Secondary | ICD-10-CM | POA: Diagnosis not present

## 2015-07-15 DIAGNOSIS — Z8249 Family history of ischemic heart disease and other diseases of the circulatory system: Secondary | ICD-10-CM | POA: Diagnosis not present

## 2015-07-15 DIAGNOSIS — I272 Other secondary pulmonary hypertension: Secondary | ICD-10-CM | POA: Diagnosis not present

## 2015-07-15 DIAGNOSIS — Z79899 Other long term (current) drug therapy: Secondary | ICD-10-CM | POA: Insufficient documentation

## 2015-07-15 DIAGNOSIS — I5022 Chronic systolic (congestive) heart failure: Secondary | ICD-10-CM | POA: Diagnosis not present

## 2015-07-15 DIAGNOSIS — I110 Hypertensive heart disease with heart failure: Secondary | ICD-10-CM | POA: Diagnosis not present

## 2015-07-15 MED ORDER — SACUBITRIL-VALSARTAN 97-103 MG PO TABS: 1.0000 | ORAL_TABLET | Freq: Two times a day (BID) | ORAL | Status: AC

## 2015-07-15 NOTE — Addendum Note (Signed)
Encounter addended by: Harvie Junior, CMA on: 07/15/2015 11:51 AM<BR>     Documentation filed: Medications, Patient Instructions Section, Orders

## 2015-07-15 NOTE — Patient Instructions (Signed)
Increase Entresto to 97/103 twice daily.  Follow [place] with Dr.Bensimhon as needed

## 2015-07-15 NOTE — Progress Notes (Signed)
Patient ID: Diana Bird, female   DOB: 08/24/1960, 55 y.o.   MRN: [id number]  ADVANCED HF CLINIC NOTE  Primary Cardiologist: Turner/ Bensimhon  HPI: Diana Bird  Is a 55 y/o woman with h/o systolic HF due to NICM dating back to 2007. She was admitted from the HF clinic on 07/12/14 with decompensated HF. Prior to this she had not been seen in the Clinic in approx 2 years. She was started on milrinone 6/11 due to sluggish diuresis and co-ox 44%. She was started on entresto and spiro on 07/15/14. Her co-ox gradually improved and was measured at 70% on 07/17/14 after milrinone wean.   During that admission, R and L heart catherization was performed on 07/17/14 and she was noted to have normal coronaries with severe NICM with EF 15% by echo, and well-compensated filling pressures with moderately reduced  While in hospital had a long discussion about advanced therapie sincluding home inotropes, LVAD, and possible transplant. Pt was seen by VAD team including Dr. Prescott Gum and thought to be a candidate for LVAD as bridging therapy. Likely Heartware as opposed to Heartmate II due to patient size.   She had a large portion of her VAD screening tests in the hospital including PFTs, CT C/A/P, pre-VAD dopplers, and cMRI which were all re-assuring for her candicacy.  She will also need to be evaluated for Heart transplant at [facility]. She is aware that the journey up to a VAD is a difficult process, and has been counseled on the importance of a "teammate". She has mentioned the possibility of this being one of her children or her sister. She has also been counseled on the importance of the HF team in managing a VAD post-placement, and what types of difficulties to expect adjusting to this.   Diana Bird returns for routines for HF follow up. Echo in March with continued slow improvement in LV function. EF 25-30%. Says she feels good. Able to walk around grocery store but has to stop once in  awhile. No edema, orthopnea or PND.  Overall doing ok.  Weight at home 120-122 pounds. Taking all medications. Moving to [place]  ECHO 3/17 EF 25-30% (images reviewed personally). RV ok   RHC 8/16 RA = 6 RV = 51/3/7 PA = 49/21 (34) PCW = 13 Fick cardiac output/index = 3.9/2.7 PVR = 6.0 WU Ao sat = 98% PA sat = 69%, 70% SVC sat = 73%  CPX (6/16): peak VO2 14.1, VE/VCO2 slope 38.1 => moderate to severe cardiac functional limitation. CPX 12/23/14: FVC 1.77 (61%)    FEV1 1.41 (60%)     FEV1/FVC 80 (98%)      Resting HR: 57 Peak HR: 105  (64% age predicted max HR) BP rest: 106/58 BP peak: 116/60 Peak VO2: 16.2 (61% predicted peak VO2) VE/VCO2 slope: 35.8 OUES: 0.88 Peak RER: 1.22 Ventilatory Threshold: 10.6 (39.9% predicted or measured peak VO2) Peak RR 69 Peak Ventilation: 38.4 VE/MVV: 58.2% PETCO2 at peak: 373 O2pulse: 8  (89% predicted O2pulse)  Echo 07/13/14: EF 15%  Echo 11/16: EF 20-25% Moderate RV dysfunction   Labs 6/16:  K 4.0 => 4.2, Cr 0.74 => 0.91, Mg 1.9, BNP 1686 Labs 01/08/2015: K 4.1 Creatinine 0.74 BNP 15  ROS: All systems negative except as listed in HPI, PMH and Problem List.  SH:  Social History   Social History  . Marital Status: Married    Spouse Name: N/A  . Number of Children: N/A  . Years of Education: N/A  Occupational History  . Not on file.   Social History Main Topics  . Smoking status: Never Smoker   . Smokeless tobacco: Never Used  . Alcohol Use: No     Comment: occasional - maybe   a glass a month.  . Drug Use: No  . Sexual Activity: Not on file   Other Topics Concern  . Not on file   Social History Narrative   Works full time doing clerical work.     FH:  Family History  Problem Relation Age of Onset  . Heart failure Mother   . Breast cancer Sister   . Colon cancer Neg Hx   . Colon polyps Neg Hx     Past Medical History  Diagnosis Date  . Chronic systolic heart failure ([place])   . Hypertension    . Anemia   . Pneumonia   . CHF (congestive heart failure) ([place])   . Shortness of breath dyspnea     at rest at  times  . 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 Hx of adenomatous polyp of colon 09/09/2014    Current Outpatient Prescriptions  Medication Sig Dispense Refill  . acetaminophen (TYLENOL) 325 MG tablet Take 2 tablets (650 mg total) by mouth every 6 (six) hours as needed for mild pain or headache.    . Calcium Carb-Cholecalciferol (CALCIUM-VITAMIN D) 600-400 MG-UNIT TABS Take 1 tablet by mouth daily.    . carvedilol (COREG) 12.5 MG tablet Take 1 tablet (12.5 mg total) by mouth 2 (two) times daily with a meal. 60 tablet 3  . digoxin (LANOXIN) 0.125 MG tablet Take 0.125 mg by mouth daily.    . ferrous sulfate 325 (65 FE) MG tablet Take 325 mg by mouth daily with breakfast.     . furosemide (LASIX) 40 MG tablet Take 40 mg by mouth daily. Take extra 40 mg as needed for swelling    . ivabradine (CORLANOR) 5 MG TABS tablet Take 1.5 tablets (7.5 mg total) by mouth 2 (two) times daily with a meal. 90 tablet 3  . methocarbamol (ROBAXIN) 500 MG tablet Take 1 tablet (500 mg total) by mouth 2 (two) times daily. 20 tablet 0  . naproxen (NAPROSYN) 500 MG tablet Take 1 tablet (500 mg total) by mouth 2 (two) times daily. 30 tablet 0  . potassium chloride SA (K-DUR,KLOR-CON) 20 MEQ tablet Take 1 tablet (20 mEq total) by mouth daily. 60 tablet 6  . sacubitril-valsartan (ENTRESTO) 49-51 MG Take 1 tablet by mouth 2 (two) times daily. 60 tablet 6  . spironolactone (ALDACTONE) 25 MG tablet Take 1 tablet (25 mg total) by mouth daily. 30 tablet 6   No current facility-administered medications for this encounter.    Filed Vitals:   07/15/15 1047  BP: 118/66  Pulse: 72  Weight: 122 lb (55.339 kg)  SpO2: 100%    PHYSICAL EXAM:  General:  Well appearing. No resp difficulty HEENT: normal Neck: supple. JVP flat Carotids 2+ bilaterally; no bruits. No lymphadenopathy or thryomegaly appreciated. Cor:  PMI laterally displaced. Regular rate & rhythm. No s3. No murmur. Lungs: clear Abdomen: soft, nontender, nondistended. No hepatosplenomegaly. No bruits or masses. Good bowel sounds. Extremities: no cyanosis, clubbing, rash, edema Neuro: alert & orientedx3, cranial nerves grossly intact. Moves all 4 extremities w/o difficulty. Affect pleasant.  ASSESSMENT & PLAN: 1. Chronic systolic HF: Nonischemic cardiomyopathy.  LV EF 25-30% by echo in March 2017. Cath 6/16 normal coronaries.   CPX with moderate cardiac functional limitation improved  20% since previous. Stable NYHA class II-III symptoms.  - Volume status stable. Continue lasix 40 mg daily. Can take extra as needed  - Continue Coreg 12.5 mg twice a day - Increase  Entresto 97-103 mg twice a day.     - Continue digoxin 0.125 mg daily  and spironolactone 25 mg daily   - Continue Corlanor to 7.5 bid -Blood Type O+ has been seen for transplant w/u at [facility] felt not to need transplant - Overall improving EF improved but still < 35%. Wants to wait on ICD for now 2. PAH  - Mild. Will follow as she has been unable to get sildenafil.   She will f/u with Cardiology in [place]. We will be available for her to contact us either personally or through her new cardiologist for information regarding her care.   Bensimhon, Daniel,MD 11:37 AM

## 2015-07-18 ENCOUNTER — Other Ambulatory Visit (HOSPITAL_COMMUNITY): Payer: Self-pay | Admitting: Pharmacist

## 2015-07-18 MED ORDER — DIGOXIN 125 MCG PO TABS: 0.1250 mg | ORAL_TABLET | Freq: Every day | ORAL | Status: AC

## 2015-08-11 ENCOUNTER — Encounter (HOSPITAL_COMMUNITY): Payer: Self-pay

## 2015-08-11 NOTE — Progress Notes (Signed)
Form 7a for [place] Disability Eligibility review completed and signed by this RN and Dr. Haroldine Laws. Forms along with supporting documents (99 pages total) faxed to provided contact information. Attn: Shane Crutch Disability manager [employer] human resources Fax # [phone] Copy of forms scanned into patient's electronic medical record. Left VM on patient's primary phone number in chart to make her aware that this had been completed and sent.  Renee Pain

## 2015-09-22 ENCOUNTER — Telehealth (HOSPITAL_COMMUNITY): Payer: Self-pay

## 2015-09-22 ENCOUNTER — Telehealth (HOSPITAL_COMMUNITY): Payer: Self-pay | Admitting: *Deleted

## 2015-09-22 MED ORDER — SPIRONOLACTONE 25 MG PO TABS: 25.0000 mg | ORAL_TABLET | Freq: Every day | ORAL | 3 refills | Status: AC

## 2015-09-22 NOTE — Telephone Encounter (Signed)
Mailed records to [facility] [place], Engineering geologist. Medical records release consent form was faxed to [place] so that we could fax records. Too many papers to fax so records were mailed

## 2015-09-22 NOTE — Telephone Encounter (Signed)
[facility] calling for refill on medication. Refill sent electronically as requested.  Renee Pain, RN

## 2015-10-22 ENCOUNTER — Other Ambulatory Visit (HOSPITAL_COMMUNITY): Payer: Self-pay | Admitting: *Deleted

## 2015-10-22 MED ORDER — IVABRADINE HCL 5 MG PO TABS: 7.5000 mg | ORAL_TABLET | Freq: Two times a day (BID) | ORAL | 3 refills | Status: AC

## 2015-10-27 ENCOUNTER — Telehealth (HOSPITAL_COMMUNITY): Payer: Self-pay | Admitting: *Deleted

## 2015-10-27 ENCOUNTER — Encounter (HOSPITAL_COMMUNITY): Payer: BC Managed Care – PPO | Admitting: Internal Medicine

## 2015-10-27 NOTE — Telephone Encounter (Signed)
Pt called to ask about her Corlanor, she states she has not been able to get that from the pharmacy.  Advised pt her PA expired in July, Erika, pharm D has submitted another PA to [employer] and we are awaiting their approval

## 2015-10-28 ENCOUNTER — Telehealth (HOSPITAL_COMMUNITY): Payer: Self-pay | Admitting: Pharmacist

## 2015-10-28 NOTE — Telephone Encounter (Signed)
Corlanor PA approved by [facility] through 10/26/16.   Ruta Hinds. Velva Harman, PharmD, BCPS, CPP Clinical Pharmacist Pager: [phone] Phone: [phone] 10/28/2015 1:53 PM

## 2015-11-21 ENCOUNTER — Encounter (HOSPITAL_COMMUNITY): Payer: BC Managed Care – PPO | Admitting: Internal Medicine

## 2016-01-28 ENCOUNTER — Other Ambulatory Visit (HOSPITAL_COMMUNITY): Payer: Self-pay | Admitting: *Deleted

## 2016-01-28 MED ORDER — FUROSEMIDE 40 MG PO TABS: 40.0000 mg | ORAL_TABLET | Freq: Every day | ORAL | 3 refills | Status: AC

## 2016-04-30 ENCOUNTER — Telehealth (HOSPITAL_COMMUNITY): Payer: Self-pay | Admitting: Cardiology

## 2016-04-30 ENCOUNTER — Other Ambulatory Visit (HOSPITAL_COMMUNITY): Payer: Self-pay | Admitting: Internal Medicine

## 2016-04-30 NOTE — Telephone Encounter (Signed)
LATE ENTRY  Multiple messages left regarding PA for entresto.  Reviewed with CHF pharmacist and was told [place].  Two messages left for patient for further details.  If patient has moved out of town we can supply/approve 30 days of medications in which time pt will need to establish with another physician for further refills.

## 2016-08-09 IMAGING — CR DG CHEST 2V
2 series · 2 of 2 positions shown · non-contrast
Comparison: 02/11/2014

CLINICAL DATA: Dyspnea.

EXAM:
CHEST  2 VIEW

[chest pa]
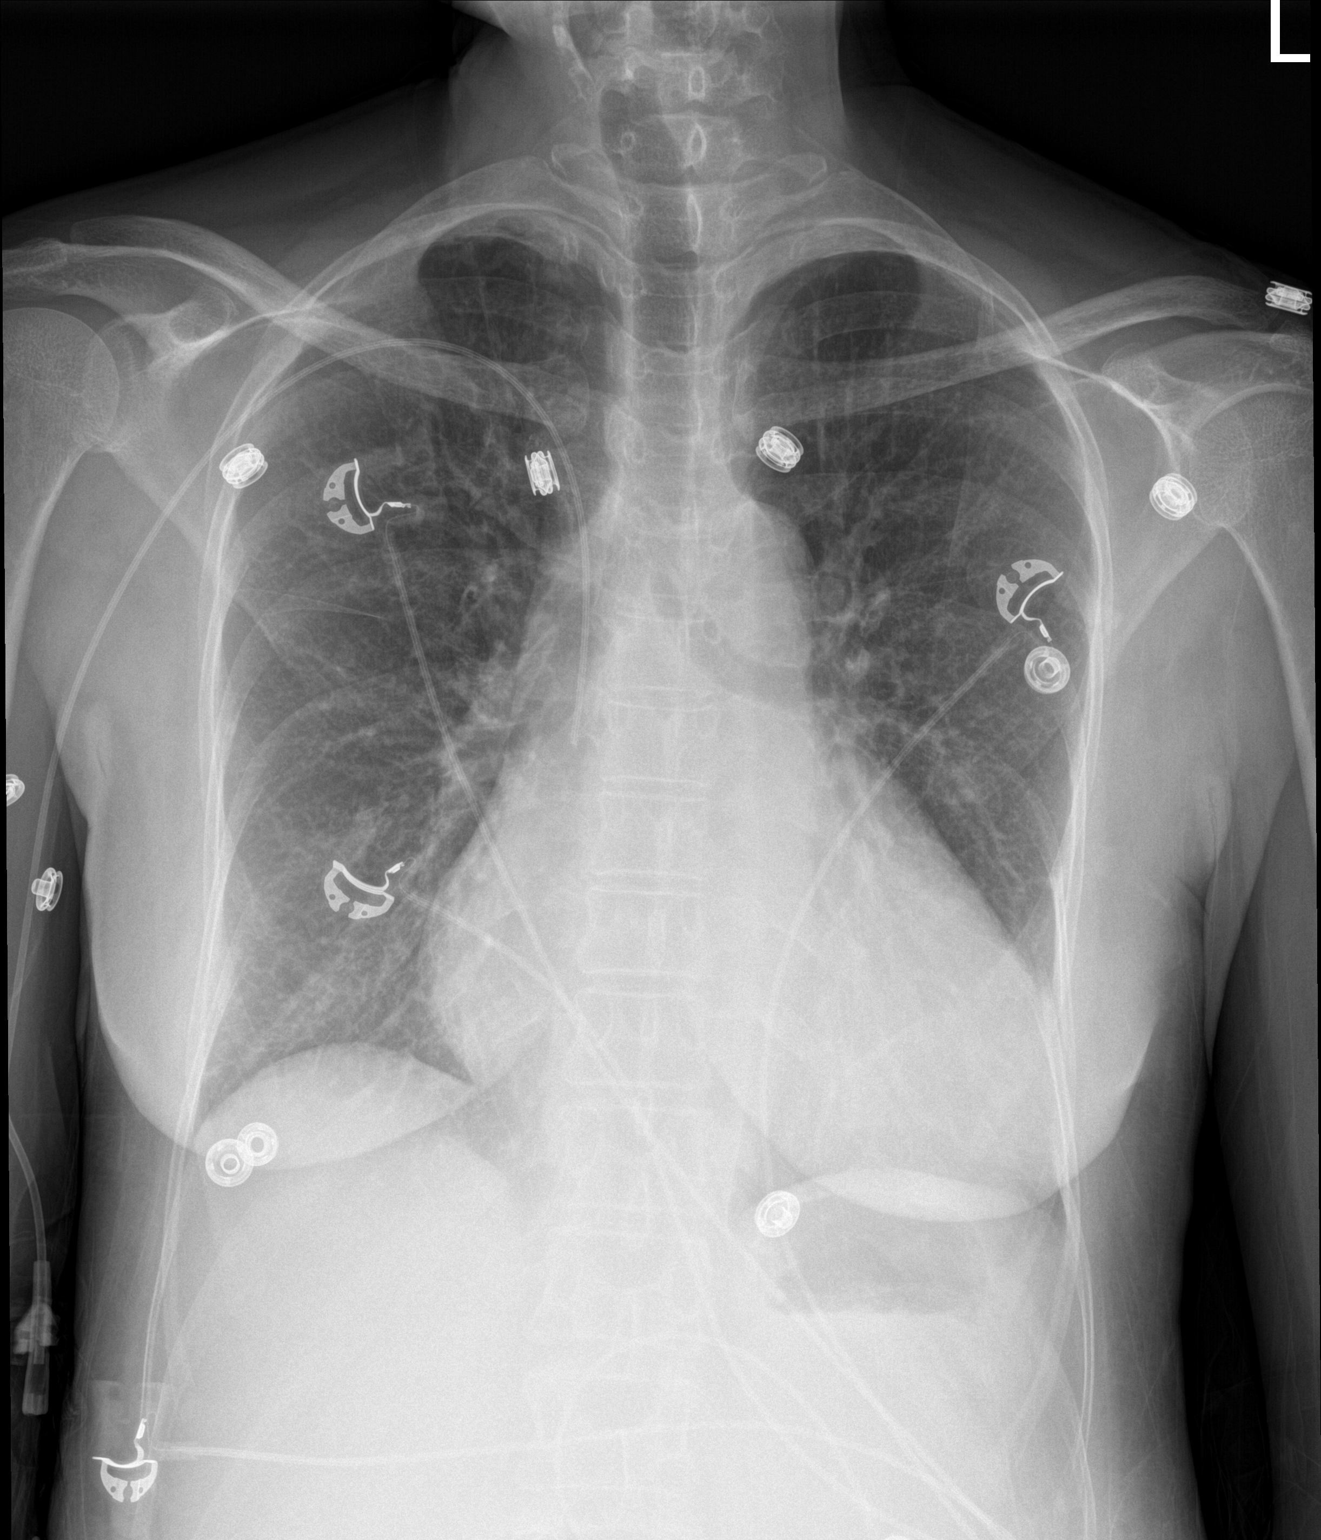

[chest lat]
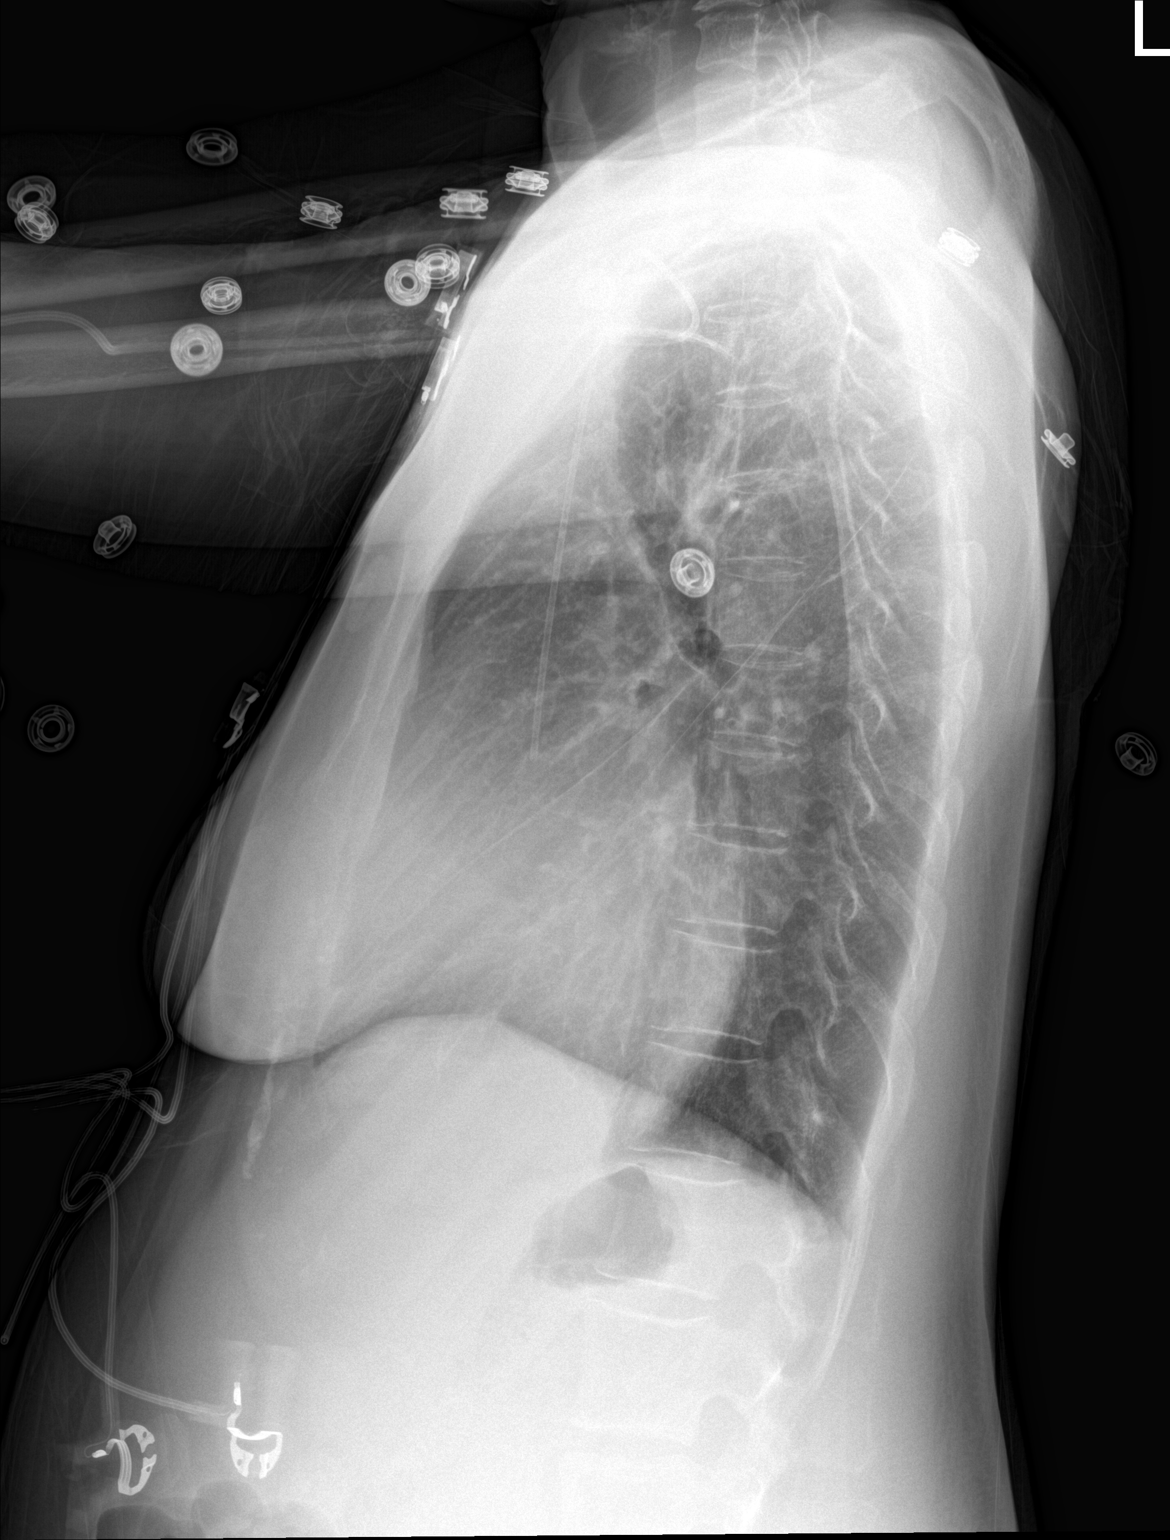

[2 of 2 positions shown; findings below may reference images not displayed]

FINDINGS: Right PICC has been placed with tip overlying the lower SVC. Cardiac
silhouette remains enlarged, unchanged. The patient has taken a
greater inspiration than on the prior study. Left lower lobe opacity
and left pleural effusion have resolved. There is no evidence of new
airspace consolidation, edema, pleural effusion, or pneumothorax. No
acute osseous abnormality is identified.
IMPRESSION: 1. Interval PICC placement as above.
2. Resolution of left lower lobe opacity and left pleural effusion.
No evidence of acute cardiopulmonary process.

## 2016-08-13 IMAGING — CR DG CHEST 2V
2 series · 2 of 2 positions shown · non-contrast
Comparison: Chest radiograph July 13, 2014; chest CT July 17, 2014

CLINICAL DATA: Preoperative evaluation for left ventricular assist
device placement

EXAM:
CHEST  2 VIEW

[w chest pa]
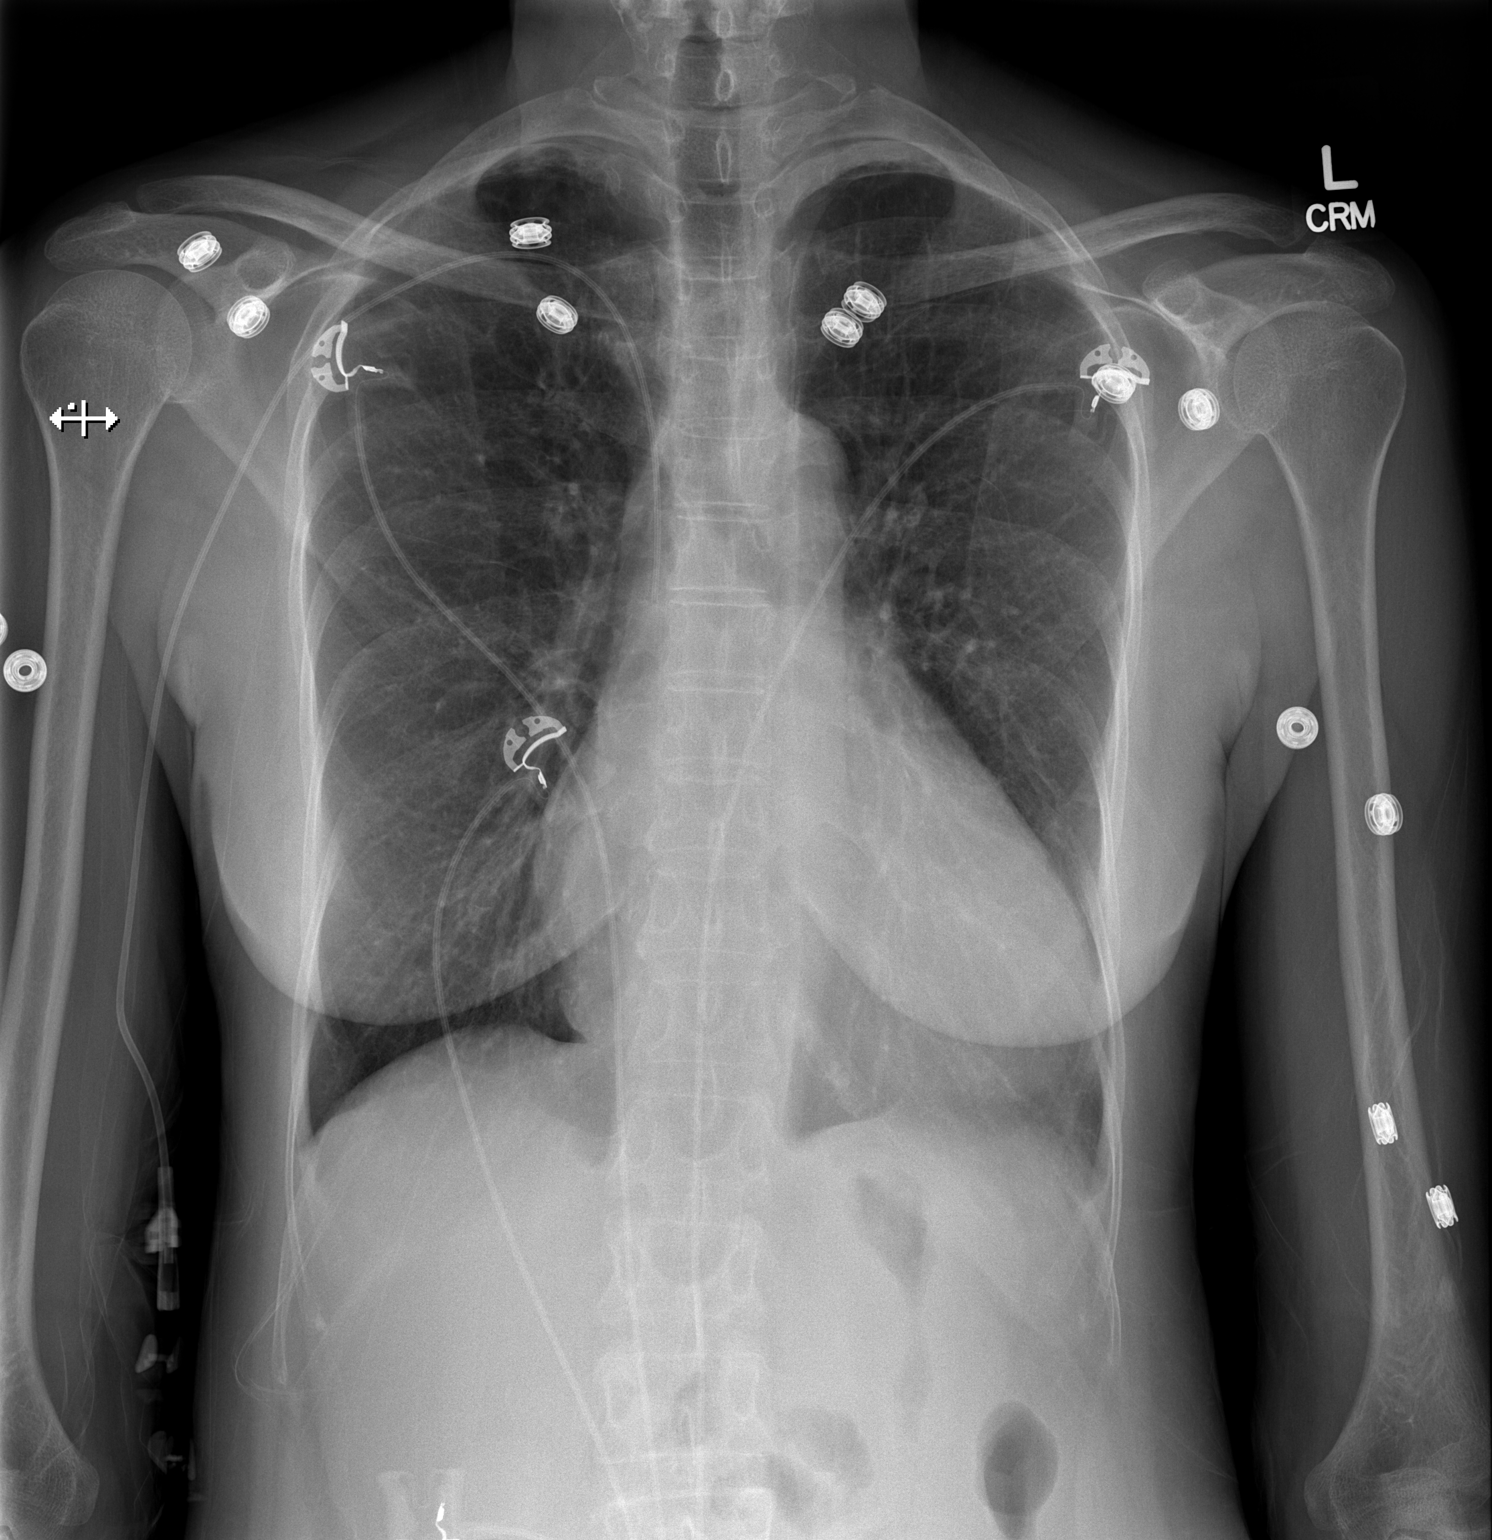

[w chest lat]
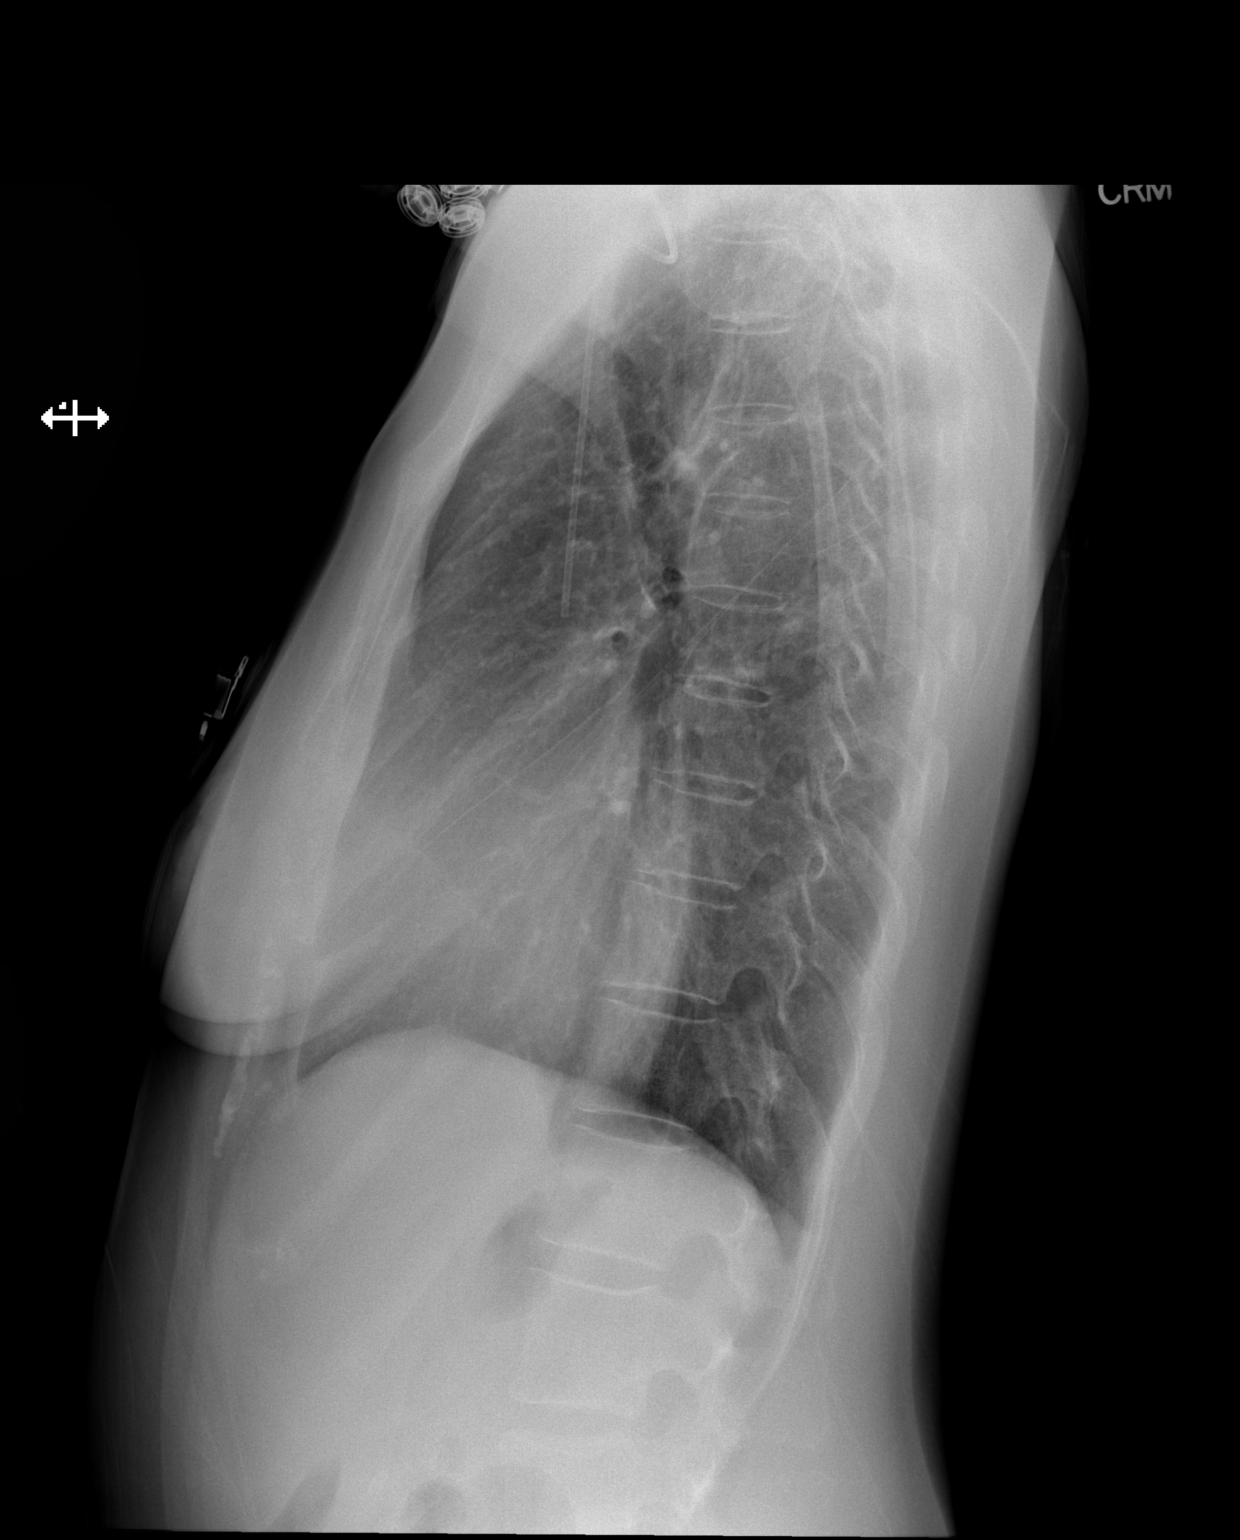

[2 of 2 positions shown; findings below may reference images not displayed]

FINDINGS: There is no edema or consolidation. There is cardiac enlargement
with pulmonary vascularity within normal limits. No adenopathy.
Central catheter tip is in the superior vena cava. No pneumothorax.
No bone lesions.
IMPRESSION: Cardiac enlargement.  No edema or consolidation.

## 2016-09-16 ENCOUNTER — Other Ambulatory Visit (HOSPITAL_COMMUNITY): Payer: Self-pay | Admitting: Internal Medicine

## 2016-09-23 ENCOUNTER — Other Ambulatory Visit (HOSPITAL_COMMUNITY): Payer: Self-pay | Admitting: Internal Medicine

## 2021-09-01 DEATH — deceased
